# Patient Record
Sex: Male | Born: 1975
Health system: Southern US, Community
[De-identification: ages and names within clinical notes are randomized; demographics above are authoritative.]

## PROBLEM LIST (undated history)

## (undated) DIAGNOSIS — I10 Essential (primary) hypertension: Secondary | ICD-10-CM

## (undated) DIAGNOSIS — M549 Dorsalgia, unspecified: Secondary | ICD-10-CM

## (undated) DIAGNOSIS — E119 Type 2 diabetes mellitus without complications: Secondary | ICD-10-CM

## (undated) DIAGNOSIS — K7581 Nonalcoholic steatohepatitis (NASH): Secondary | ICD-10-CM

## (undated) HISTORY — DX: Nonalcoholic steatohepatitis (NASH): K75.81

## (undated) HISTORY — PX: KNEE SURGERY: SHX244

## (undated) HISTORY — PX: CHOLECYSTECTOMY: SHX55

## (undated) HISTORY — PX: SHOULDER SURGERY: SHX246

---

## 2013-08-30 ENCOUNTER — Encounter (HOSPITAL_COMMUNITY): Payer: Self-pay | Admitting: Emergency Medicine

## 2013-08-30 ENCOUNTER — Emergency Department (HOSPITAL_COMMUNITY): Payer: No Typology Code available for payment source

## 2013-08-30 ENCOUNTER — Emergency Department (HOSPITAL_COMMUNITY)
Admission: EM | Admit: 2013-08-30 | Discharge: 2013-08-30 | Disposition: A | Discharge status: Home / Self Care | Payer: No Typology Code available for payment source | Attending: Emergency Medicine | Admitting: Emergency Medicine

## 2013-08-30 DIAGNOSIS — F17200 Nicotine dependence, unspecified, uncomplicated: Secondary | ICD-10-CM | POA: Insufficient documentation

## 2013-08-30 DIAGNOSIS — Y92410 Unspecified street and highway as the place of occurrence of the external cause: Secondary | ICD-10-CM | POA: Insufficient documentation

## 2013-08-30 DIAGNOSIS — Y9389 Activity, other specified: Secondary | ICD-10-CM | POA: Insufficient documentation

## 2013-08-30 DIAGNOSIS — S0993XA Unspecified injury of face, initial encounter: Secondary | ICD-10-CM | POA: Insufficient documentation

## 2013-08-30 DIAGNOSIS — S335XXA Sprain of ligaments of lumbar spine, initial encounter: Secondary | ICD-10-CM | POA: Insufficient documentation

## 2013-08-30 DIAGNOSIS — S39012A Strain of muscle, fascia and tendon of lower back, initial encounter: Secondary | ICD-10-CM

## 2013-08-30 DIAGNOSIS — S199XXA Unspecified injury of neck, initial encounter: Secondary | ICD-10-CM

## 2013-08-30 HISTORY — DX: Dorsalgia, unspecified: M54.9

## 2013-08-30 MED ORDER — NAPROXEN 375 MG PO TABS: 375.0000 mg | ORAL_TABLET | Freq: Two times a day (BID) | ORAL | Status: DC

## 2013-08-30 MED ORDER — METHOCARBAMOL 500 MG PO TABS: 500.0000 mg | ORAL_TABLET | Freq: Two times a day (BID) | ORAL | Status: DC

## 2013-08-30 NOTE — ED Notes (Signed)
Patient transported to X-ray, ambulated w/o difficulty to xray

## 2013-08-30 NOTE — Discharge Instructions (Signed)
Please call your doctor for a followup appointment within 24-48 hours. When you talk to your doctor please let them know that you were seen in the emergency department and have them acquire all of your records so that they can discuss the findings with you and formulate a treatment plan to fully care for your new and ongoing problems. Please call and set-up an appointment with health and wellness center Please call and set-up an appointment with orthopedics Please take medications as prescribed While on naproxen please do not take any anti-inflammatories for this can lead to GI bleeds-for example ibuprofen, Motrin Please rest and stay hydrated Please avoid any physical or strenuous activity Please continue to monitor symptoms closely and if symptoms are to worsen or change (fever greater than 101, chills, chest pain, shortness of breath, difficulty breathing, numbness, tingling, fall, injury, headaches, dizziness, confusion, inability to control urine or bowel movements, worsening or changes to pain) please report back to the ED immediately   Back Pain, Adult Back pain is very common. The pain often gets better over time. The cause of back pain is usually not dangerous. Most people can learn to manage their back pain on their own.  HOME CARE   Stay active. Start with short walks on flat ground if you can. Try to walk farther each day.  Do not sit, drive, or stand in one place for more than 30 minutes. Do not stay in bed.  Do not avoid exercise or work. Activity can help your back heal faster.  Be careful when you bend or lift an object. Bend at your knees, keep the object close to you, and do not twist.  Sleep on a firm mattress. Lie on your side, and bend your knees. If you lie on your back, put a pillow under your knees.  Only take medicines as told by your doctor.  Put ice on the injured area.  Put ice in a plastic bag.  Place a towel between your skin and the bag.  Leave the ice  on for 15-20 minutes, 03-04 times a day for the first 2 to 3 days. After that, you can switch between ice and heat packs.  Ask your doctor about back exercises or massage.  Avoid feeling anxious or stressed. Find good ways to deal with stress, such as exercise. GET HELP RIGHT AWAY IF:   Your pain does not go away with rest or medicine.  Your pain does not go away in 1 week.  You have new problems.  You do not feel well.  The pain spreads into your legs.  You cannot control when you poop (bowel movement) or pee (urinate).  Your arms or legs feel weak or lose feeling (numbness).  You feel sick to your stomach (nauseous) or throw up (vomit).  You have belly (abdominal) pain.  You feel like you may pass out (faint). MAKE SURE YOU:   Understand these instructions.  Will watch your condition.  Will get help right away if you are not doing well or get worse. Document Released: 10/16/2007 Document Revised: 07/22/2011 Document Reviewed: 09/17/2010 [facility] Patient Information 2014 [place].  Back Exercises Back exercises help treat and prevent back injuries. The goal is to increase your strength in your belly (abdominal) and back muscles. These exercises can also help with flexibility. Start these exercises when told by your doctor. HOME CARE Back exercises include: Pelvic Tilt.  Lie on your back with your knees bent. Tilt your pelvis until the lower  part of your back is against the floor. Hold this position 5 to 10 sec. Repeat this exercise 5 to 10 times. Knee to Chest.  Pull 1 knee up against your chest and hold for 20 to 30 seconds. Repeat this with the other knee. This may be done with the other leg straight or bent, whichever feels better. Then, pull both knees up against your chest. Sit-Ups or Curl-Ups.  Bend your knees 90 degrees. Start with tilting your pelvis, and do a partial, slow sit-up. Only lift your upper half 30 to 45 degrees off the floor. Take at least  2 to 3 seonds for each sit-up. Do not do sit-ups with your knees out straight. If partial sit-ups are difficult, simply do the above but with only tightening your belly (abdominal) muscles and holding it as told. Hip-Lift.  Lie on your back with your knees flexed 90 degrees. Push down with your feet and shoulders as you raise your hips 2 inches off the floor. Hold for 10 seconds, repeat 5 to 10 times. Back Arches.  Lie on your stomach. Prop yourself up on bent elbows. Slowly press on your hands, causing an arch in your low back. Repeat 3 to 5 times. Shoulder-Lifts.  Lie face down with arms beside your body. Keep hips and belly pressed to floor as you slowly lift your head and shoulders off the floor. Do not overdo your exercises. Be careful in the beginning. Exercises may cause you some mild back discomfort. If the pain lasts for more than 15 minutes, stop the exercises until you see your doctor. Improvement with exercise for back problems is slow.  Document Released: 06/01/2010 Document Revised: 07/22/2011 Document Reviewed: 02/28/2011 [facility] Patient Information 2014 [place], [place].   Emergency Department Resource Guide 1) Find a Doctor and Pay Out of Pocket Although you won't have to find out who is covered by your insurance plan, it is a good idea to ask around and get recommendations. You will then need to call the office and see if the doctor you have chosen will accept you as a new patient and what types of options they offer for patients who are self-pay. Some doctors offer discounts or will set up payment plans for their patients who do not have insurance, but you will need to ask so you aren't surprised when you get to your appointment.  2) Contact Your Local Health Department Not all health departments have doctors that can see patients for sick visits, but many do, so it is worth a call to see if yours does. If you don't know where your local health department is, you can check in  your phone book. The CDC also has a tool to help you locate your state's health department, and many state websites also have listings of all of their local health departments.  3) Find a [facility] If your illness is not likely to be very severe or complicated, you may want to try a walk in clinic. These are popping up all over the country in pharmacies, drugstores, and shopping centers. They're usually staffed by nurse practitioners or physician assistants that have been trained to treat common illnesses and complaints. They're usually fairly quick and inexpensive. However, if you have serious medical issues or chronic medical problems, these are probably not your best option.  No Primary Care Doctor: - Call Health Connect at  [phone] - they can help you locate a primary care doctor that  accepts your insurance, provides certain services,  etc. - Physician Referral Service- [phone]  Chronic Pain Problems: Organization         Address  Phone   Notes  [facility]  [phone] Patients need to be referred by their primary care doctor.   Medication Assistance: Organization         Address  Phone   Notes  [facility] [address] [place], [place] [address] [phone] --Must be a resident of [facility] -- Must have NO insurance coverage whatsoever (no Medicaid/ Medicare, etc.) -- The pt. MUST have a primary care doctor that directs their care regularly and follows them in the community   Med[facility]ist  [phone]   [employer]  [phone]    Agencies that provide inexpensive medical care: Organization         Address  Phone   Notes  [place]  [phone]   [facility]    [phone]   [facility] [place], [place] [address] [phone]   [place] [address], [place] [phone]    Planned Parenthood    [phone]   [facility]    [phone]   [place] and [place] [address], [place] Phone:  [phone], Fax:  [phone] Hours of Operation:  9 am - 6 pm, M-F.  Also accepts Medicaid/Medicare and self-pay.  [facility] for [place], [address], [place] Phone: [phone], Fax: [phone]. Hours of Operation:  8:30 am - 5:30 pm, M-F.  Also accepts Medicaid and self-pay.  [facility] [place] [address] [place] Phone: [phone]   [place], [place], [place] [phone], Ext. [phone] Mondays & Thursdays: 7-9 AM.  First 15 patients are seen on a first come, first serve basis.    [place]ale Providers:  Organization         Address  Phone   Notes  [facility] [address], [place] [phone] Also accepts self-pay patients.  [facility] [address], [place]  [phone]   [place], [address], [place] [phone]   [facility] [address], [place] [phone]   Lucianne Lei [address], [place]   [phone] Only accepts [place] Access [place] patients after they have their name applied to their card.   Self-Pay (no insurance) in [facility]:  Organization         Address  Phone   Notes  Sickle Cell Patients, [facility] [place] [phone]   [facility] [place] [phone]   [facility] [place]  [place], [place], [place] [phone]   [facility]/Dr. Osei-Bonsu  [address], [place] or [address], [place] [phone] Phone number for both [place] and [place] locations is the  same.  Urgent Medical and [facility]) [address] [place] [phone]   [facility] [address] [place] or [address] [phone]   [facility] [place], [place] [phone], phone; [phone], fax Sees patients 1st and 3rd Saturday of every month.  Must not qualify for public or private insurance (i.e. Medicaid, Medicare, [place] Health Choice, Veterans' Benefits)  Household income should be no more than 200% of the poverty level The clinic cannot treat you if you are pregnant or think you are pregnant  Sexually transmitted diseases are not treated at the clinic.    Dental Care: Organization         Address  Phone  Notes  [facility] Department of [facility] [phone] Accepts children up to age 73 who are enrolled in [place] or [place]; pregnant women with a Medicaid card; and children who have applied for Medicaid or [place] Health Choice, but were declined, whose parents can pay a reduced fee at time of service.  [facility] Department of [facility]  [address], [place] [phone] Accepts children up to age 62 who are enrolled in [place] or [place]; pregnant women with a Medicaid card; and children who have applied for Medicaid or [place] Health Choice, but were declined, whose parents can pay a reduced fee at time of service.  [facility] Adult [facility] Access PROGRAM  [place] [phone] Patients are seen by appointment only. Walk-ins are not accepted. War will see patients 78 years of age and older. Monday - Tuesday (8am-5pm) Most Wednesdays (8:30-5pm) $30 per visit, cash only  [facility] Adult Dental Access PROGRAM  [address], [facility] [phone] Patients are seen by appointment only. Walk-ins are not accepted. Ida will see patients  29 years of age and older. One Wednesday Evening (Monthly: Volunteer Based).  $30 per visit, cash only  [place]  [phone] for adults; Children under age 52, call Graduate Pediatric Dentistry at [phone]. Children aged 65-14, please call [phone] to request a pediatric application.  Dental services are provided in all areas of dental care including fillings, crowns and bridges, complete and partial dentures, implants, gum treatment, root canals, and extractions. Preventive care is also provided. Treatment is provided to both adults and children. Patients are selected via a lottery and there is often a waiting list.   [facility] [address] [place]  [phone] [email or web address]   [facility] [address] [place], [place] [phone], Ext. [phone] Second and Fourth Thursday of each month, opens at 6:30 AM; Clinic ends at 9 AM.  Patients are seen on a first-come first-served basis, and a limited number are seen during each clinic.   [facility]  [address] [place], [place] [phone]   Eligibility Requirements You must have lived in [place], [place], or [place] counties for at least the last three months.   You cannot be eligible for state or federal sponsored [employer], including [employer], [place], or [employer].   You generally cannot be eligible for healthcare insurance through your employer.    How to apply: Eligibility screenings are held every Tuesday and Wednesday afternoon from 1:00 pm until 4:00 pm. You do not need an appointment for the interview!  [facility] [address], [place], [place]   [place]  [place]  [place]  [phone]    Behavioral Health Resources  in the Community: Intensive Outpatient  Programs Organization         Address  Phone  Notes  [facility]. [address], [place], [place] [phone]   [facility] Outpatient [address] [place], [place]   ADS: Alcohol & Drug [facility] [address] [place], [place]   [employer] [address],  [place], [place] or [phone]   Substance Abuse Resources Organization         Address  Phone  Notes  Alcohol and Drug Services  [phone]   [place]  [phone]   [facility] [place]   Chinita Pester  [phone]   Residential & Outpatient Substance Abuse Program  [phone]   Psychological Services Organization         Address  Phone  Notes  [facility] [place]  [place]  [phone]   [place] [address], [place] or [phone]    Mobile Crisis Teams Organization         Address  Phone  Notes  [facility], Mobile Crisis Care Unit  [phone]   [facility]  [address] [place], [place]   [place] [address] [place] [phone]    Self-Help/Support Groups Organization         Address  Phone             Notes  [place]. of [place] - variety of support groups  [place] Call for more information  Narcotics Anonymous (NA), [facility] [address], [employer] [place]  2 meetings at this location   Special educational needs teacher         Address  Phone  Notes  ASAP Residential Treatment [place],    [place]  [phone]   [facility]  [address] [place] [address], [place], [place]   [place], [place] [phone] Admissions: 8am-3pm M-F  Incentives Substance [place] [address],    [place], [place]  [phone]   [facility] [address] [place], [place], [place]   [facility] [address],  [place], [place]   [facility]ight Programs - Intensive Outpatient [address], [place], [place]   [facility] ([place].,  [place], [place] [phone] or [phone]   [facility]idential Treatment Services (RTS) [address] [place], [place] Accepts Medicaid  [facility] [place] [address]  [place] [phone] Substance Abuse/Addiction Treatment   [facility] Organization         Address  Phone  Notes  [facility]  [phone]   Domenic Schwab, PhD [address] [place]lis [place] [employer], [place]   [phone] or [phone]   [place], [place] [phone]   [place] [address] [place], [place] [phone] Insurance/Medicaid/sponsorship through [employer] and Families [address] [place]                                    [place], [place] [phone] [place] [address]   [place], [place] [phone])  [phone]    Dr. Adele Schilder  [phone]   [facility] Dept. 1) [address], [place] 2) [place] 3)  [place] [address], [place] [phone]  [phone]   [place] [phone] or [phone] (After Hours)
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
## 2013-08-30 NOTE — ED Notes (Signed)
Pt ambulated to restroom w/o difficulty, no assistance needed.
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
## 2013-08-30 NOTE — ED Notes (Signed)
Pt ambulated in hallway w/ no difficulty, seen by PA.
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
## 2013-08-31 NOTE — ED Provider Notes (Signed)
Medical screening examination/treatment/procedure(s) were performed by non-physician practitioner and as supervising physician I was immediately available for consultation/collaboration.   EKG Interpretation None       Virgel Manifold, MD 08/31/13 0700

## 2013-12-16 DIAGNOSIS — Z9889 Other specified postprocedural states: Secondary | ICD-10-CM | POA: Insufficient documentation

## 2013-12-16 DIAGNOSIS — M1711 Unilateral primary osteoarthritis, right knee: Secondary | ICD-10-CM | POA: Insufficient documentation

## 2013-12-16 HISTORY — DX: Other specified postprocedural states: Z98.890

## 2013-12-16 HISTORY — DX: Unilateral primary osteoarthritis, right knee: M17.11

## 2016-11-15 ENCOUNTER — Emergency Department (HOSPITAL_COMMUNITY)
Admission: EM | Admit: 2016-11-15 | Discharge: 2016-11-16 | Disposition: A | Discharge status: Home / Self Care | Payer: Medicare Other | Attending: Emergency Medicine | Admitting: Emergency Medicine

## 2016-11-15 ENCOUNTER — Encounter (HOSPITAL_COMMUNITY): Payer: Self-pay | Admitting: Emergency Medicine

## 2016-11-15 ENCOUNTER — Emergency Department (HOSPITAL_COMMUNITY): Payer: Medicare Other

## 2016-11-15 DIAGNOSIS — M25561 Pain in right knee: Secondary | ICD-10-CM | POA: Diagnosis not present

## 2016-11-15 DIAGNOSIS — F17200 Nicotine dependence, unspecified, uncomplicated: Secondary | ICD-10-CM | POA: Insufficient documentation

## 2016-11-15 NOTE — ED Triage Notes (Signed)
Pt c/o R knee pain worsening last 3 weeks, pt reports increasing swelling recently. Surgery to same several years ago.

## 2016-11-15 NOTE — ED Provider Notes (Signed)
[facility] DEPT Provider Note   CSN: [id number] Arrival date & time: 11/15/16  2250  By signing my name below, I, Collene Leyden, attest that this documentation has been prepared under the direction and in the presence of [employer]. Electronically Signed: Collene Leyden, Scribe. 11/15/16. 12:05 AM.  History   Chief Complaint Chief Complaint  Patient presents with  . Knee Pain    HPI Comments: Todd Rose is a 41 y.o. male with a history of right knee surgery (arthroscopy repair of the lateral meniscus and ACL), who presents to the Emergency Department complaining of gradual-onset, constant right knee pain that began one month ago. Patient states his pain had progressively worsened over the past week to the point that his right knee gives out. Patient denies any new injury or fall. Patient reports an associated burning sensation and warmness on palpation of the lateral R knee ("feels like your hands are hot"). Patient reports taking motrin with mild relief. Patient is ambulatory in the emergency department without difficulty. Ambulating worsens his pain. Patient denies any numbness, tingling, weakness, fever, or chills. No history of blood clots. Patient denies any pain, recent travel, or any additional symptoms.   The history is provided by the patient. No language interpreter was used.    Past Medical History:  Diagnosis Date  . Back pain     There are no active problems to display for this patient.   Past Surgical History:  Procedure Laterality Date  . CHOLECYSTECTOMY    . KNEE SURGERY    . SHOULDER SURGERY     left       Home Medications    Prior to Admission medications   Medication Sig Start Date End Date Taking? Authorizing Provider  ibuprofen (ADVIL,MOTRIN) 200 MG tablet Take 800 mg by mouth every 6 (six) hours as needed for moderate pain.    [provider]  methocarbamol (ROBAXIN) 500 MG tablet Take 1 tablet (500 mg total) by mouth 2 (two)  times daily. 08/30/13   Sciacca, Marissa, PA-C  naproxen (NAPROSYN) 375 MG tablet Take 1 tablet (375 mg total) by mouth 2 (two) times daily. 08/30/13   Sciacca, Marissa, PA-C  Probiotic Product (PROBIOTIC DAILY) CAPS Take 1 capsule by mouth daily.    [provider]    Family History No family history on file.  Social History Social History  Substance Use Topics  . Smoking status: Current Every Day Smoker  . Smokeless tobacco: Never Used  . Alcohol use Yes     Allergies   Tramadol   Review of Systems Review of Systems  Constitutional: Negative for chills and fever.  Musculoskeletal: Positive for arthralgias (right knee). Negative for joint swelling.  Neurological: Negative for weakness and numbness.     Physical Exam Updated Vital Signs BP (!) 130/96 (BP Location: Left Arm)   Pulse 93   Temp 98.3 F (36.8 C) (Oral)   Resp 18   Ht 5' 7"  (1.702 m)   Wt 119.7 kg (264 lb)   SpO2 96%   BMI 41.35 kg/m   Physical Exam  Constitutional: He appears well-developed and well-nourished. No distress.  HENT:  Head: Normocephalic and atraumatic.  Mouth/Throat: Oropharynx is clear and moist. No oropharyngeal exudate.  Eyes: Conjunctivae are normal. Pupils are equal, round, and reactive to light. Right eye exhibits no discharge. Left eye exhibits no discharge. No scleral icterus.  Neck: Normal range of motion. Neck supple. No thyromegaly present.  Cardiovascular: Normal rate, regular rhythm, normal  heart sounds and intact distal pulses.  Exam reveals no gallop and no friction rub.   No murmur heard. Pulmonary/Chest: Effort normal and breath sounds normal. No stridor. No respiratory distress. He has no wheezes. He has no rales.  Musculoskeletal: Normal range of motion. He exhibits tenderness. He exhibits no edema or deformity.  Tenderness to the right, lateral knee over the LCL; pain to lateral knee with varus stress; negative anterior/posterior drawer, negative McMurray's  test, no warmth or erythema, no edema noted, no pain with passive range of motion; normal sensation  Lymphadenopathy:    He has no cervical adenopathy.  Neurological: He is alert. Coordination normal.  Skin: Skin is warm and dry. No rash noted. He is not diaphoretic. No pallor.  Psychiatric: He has a normal mood and affect.  Nursing note and vitals reviewed.    ED Treatments / Results  DIAGNOSTIC STUDIES: Oxygen Saturation is 96% on RA, normal by my interpretation.    COORDINATION OF CARE: 12:04 AM Discussed treatment plan with pt at bedside and pt agreed to plan, which includes a knee brace, ibuprofen, and tylenol.   Labs (all labs ordered are listed, but only abnormal results are displayed) Labs Reviewed - No data to display  EKG  EKG Interpretation None       Radiology Dg Knee Complete 4 Views Right  Result Date: 11/15/2016 CLINICAL DATA:  Right knee pain times 4-[redacted] weeks along the lateral aspect. Prior history of surgery to the right knee. EXAM: RIGHT KNEE - COMPLETE 4+ VIEW COMPARISON:  08/08/2016 FINDINGS: No evidence of fracture, dislocation, or joint effusion. No evidence of arthropathy or other focal bone abnormality. Soft tissues are unremarkable. IMPRESSION: Negative. Electronically Signed   By: Ashley Royalty M.D.   On: 11/15/2016 23:41    Procedures Procedures (including critical care time)  Medications Ordered in ED Medications - No data to display   Initial Impression / Assessment and Plan / ED Course  I have reviewed the triage vital signs and the nursing notes.  Pertinent labs & imaging results that were available during my care of the patient were reviewed by me and considered in my medical decision making (see chart for details).      Patient X-Ray negative for obvious fracture or dislocation. Doubt septic joint or other emergent pathology at this time. Gout unlikely as burning sensation only to lateral aspect of the knee. Pt advised to follow up with  orthopedics For further evaluation and treatment. Patient given a right knee brace while in ED, conservative therapy recommended and discussed. Patient will be discharged home & is agreeable with above plan. I discussed patient case with Dr. Wyvonnia Dusky his agrees with plan.  Final Clinical Impressions(s) / ED Diagnoses   Final diagnoses:  Acute pain of right knee    New Prescriptions New Prescriptions   No medications on file   I personally performed the services described in this documentation, which was scribed in my presence. The recorded information has been reviewed and is accurate.     Frederica Kuster, PA-C 11/16/16 Lindell Noe, MD 11/16/16 [phone]

## 2016-11-16 NOTE — Discharge Instructions (Signed)
Use ice 3-4 times daily alternating 20 minutes on, 20 minutes off. Use your knee sleeve at all times except when bathing. Keep your leg elevated when of your not walking on it. Please follow up with Dr. Ronnie Derby for further evaluation and treatment of your knee pain.

## 2017-04-23 ENCOUNTER — Other Ambulatory Visit: Payer: Self-pay

## 2017-04-23 ENCOUNTER — Encounter (HOSPITAL_COMMUNITY): Payer: Self-pay

## 2017-04-23 ENCOUNTER — Emergency Department (HOSPITAL_COMMUNITY): Payer: Medicare Other

## 2017-04-23 ENCOUNTER — Emergency Department (HOSPITAL_COMMUNITY)
Admission: EM | Admit: 2017-04-23 | Discharge: 2017-04-23 | Disposition: A | Discharge status: Home / Self Care | Payer: Medicare Other | Attending: Emergency Medicine | Admitting: Emergency Medicine

## 2017-04-23 DIAGNOSIS — X509XXA Other and unspecified overexertion or strenuous movements or postures, initial encounter: Secondary | ICD-10-CM | POA: Insufficient documentation

## 2017-04-23 DIAGNOSIS — F17200 Nicotine dependence, unspecified, uncomplicated: Secondary | ICD-10-CM | POA: Insufficient documentation

## 2017-04-23 DIAGNOSIS — Z79899 Other long term (current) drug therapy: Secondary | ICD-10-CM | POA: Diagnosis not present

## 2017-04-23 DIAGNOSIS — Y999 Unspecified external cause status: Secondary | ICD-10-CM | POA: Diagnosis not present

## 2017-04-23 DIAGNOSIS — Y939 Activity, unspecified: Secondary | ICD-10-CM | POA: Diagnosis not present

## 2017-04-23 DIAGNOSIS — R300 Dysuria: Secondary | ICD-10-CM | POA: Diagnosis not present

## 2017-04-23 DIAGNOSIS — Y929 Unspecified place or not applicable: Secondary | ICD-10-CM | POA: Insufficient documentation

## 2017-04-23 DIAGNOSIS — M25561 Pain in right knee: Secondary | ICD-10-CM | POA: Diagnosis not present

## 2017-04-23 LAB — URINALYSIS, ROUTINE W REFLEX MICROSCOPIC
Bilirubin Urine: NEGATIVE
Glucose, UA: NEGATIVE mg/dL
Hgb urine dipstick: NEGATIVE
Ketones, ur: NEGATIVE mg/dL
Leukocytes, UA: NEGATIVE
NITRITE: NEGATIVE
Protein, ur: NEGATIVE mg/dL
Specific Gravity, Urine: 1.019 (ref 1.005–1.030)
pH: 5 (ref 5.0–8.0)

## 2017-04-23 MED ORDER — MELOXICAM 15 MG PO TABS: 15.0000 mg | ORAL_TABLET | Freq: Every day | ORAL | 0 refills | Status: DC

## 2017-04-23 MED ORDER — ONDANSETRON 4 MG PO TBDP
4.0000 mg | ORAL_TABLET | Freq: Once | ORAL | Status: AC
  Administered 2017-04-23: 4 mg via ORAL
  Filled 2017-04-23: qty 1

## 2017-04-23 MED ORDER — CYCLOBENZAPRINE HCL 10 MG PO TABS: 10.0000 mg | ORAL_TABLET | Freq: Two times a day (BID) | ORAL | 0 refills | Status: DC | PRN

## 2017-04-23 MED ORDER — OXYCODONE-ACETAMINOPHEN 5-325 MG PO TABS
1.0000 | ORAL_TABLET | Freq: Once | ORAL | Status: AC
  Administered 2017-04-23: 1 via ORAL
  Filled 2017-04-23: qty 1

## 2017-04-23 NOTE — ED Notes (Signed)
Ortho has been paged to apply knee immobilizer.

## 2017-04-23 NOTE — ED Triage Notes (Signed)
PT reports hx of small torn meniscus to right knee that orthopedic dr didn't feel was severe enough for operation. 2 days ago pt states he stepped in a hole and twisted knee. Ever since pt has experienced increased pain to knee with extension.

## 2017-04-23 NOTE — ED Notes (Signed)
On way to XR 

## 2017-04-23 NOTE — Progress Notes (Signed)
Orthopedic Tech Progress Note Patient Details:  Todd Rose 1976/01/29 [id number]  Ortho Devices Type of Ortho Device: Knee Immobilizer Ortho Device/Splint Location: RLE Ortho Device/Splint Interventions: Ordered, Application   Post Interventions Patient Tolerated: Well Instructions Provided: Care of device   Braulio Bosch 04/23/2017, 10:23 PM

## 2017-04-23 NOTE — Discharge Instructions (Signed)
The x-ray of your knee did not demonstrate any fractures or broken bones.  Your exam was concerning for involvement of the LCL ligament and the knee.  Please wear the knee immobilizer while you are walking until you are seen next week by Dr. Ronnie Derby.  Apply ice to the knee for 15-20 minutes up to 3-4 times a day.  You can take the knee immobilizer off when you are resting, but please elevate the leg up above the level of your heart.   Take 1 tablet of meloxicam with food daily to help with knee and back pain.  It is important that you take this medication with food so you do not upset your stomach.  Do not take Motrin, ibuprofen, naproxen, or Aleve while taking this medication.  Flexeril can be taken up to 2 times daily to help with muscle pain and spasms.  Do not drive or work while taking this medication because it can make you drowsy.   It appears that the deep blue cream that you have used for aches and pains contains peppermint oil.  There is several over-the-counter products available that also contain peppermint oil that may also be applied directly to the skin.  If you continue to have discomfort with urination, please follow-up with [facility] Scal[facility].  Your urine sample today did not look concerning for a kidney stone or infection.  It sounds as if you were previously diagnosed with an enlarged prostate also called benign prostatic hyperplasia.   If you develop new or worsening symptoms, including fever, chills, if the knee becomes red, hot, or swollen, or if you develop weakness in the leg, or other concerning symptoms, please return to the emergency department for reevaluation.

## 2017-04-23 NOTE — ED Provider Notes (Signed)
[facility] EMERGENC[facility] DEPARTMENT Provider Note   CSN: [id number] Arrival date & time: 04/23/17  1737     History   Chief Complaint Chief Complaint  Patient presents with  . Knee Injury    HPI Todd Rose is a 41 y.o. male with a history of chronic low back pain and a right medial meniscus tear status post arthroscopy who presents to the emergency department with a chief complaint of right knee pain that began 2 days ago after he stepped into a hole.  He reports that after his foot landed in the whole that his knee inverted to the left while his torso twisted externally to the right. He reports that he has able to ambulate following the incident.  He reports constant, sharp pain over the lateral and posterior aspect of the right knee that is aggravated with being in one position for too long and improved by nothing.  He has treated his symptoms with elevation, ice, and rest without improvement.  He denies any additional falls, but states that it feels like his knee might give out at times.  He denies right hip, ankle, or left knee pain.  He denies hitting his head, LOC, nausea, or emesis.  He also complains of intermittent, worsening left flank pain with associated nausea, 2-3 episodes of NBNB emesis, dysuria, and urgency with voiding over the last 2 weeks. No hematuria, fever, chills, abdominal pain.   No h/o of nephrolithiasis or UTI. He reports a familial h/o of nephrolithiasis.   He was a follow up appointment with his orthopedist Dr. Ronnie Derby one week from tomorrow.    The history is provided by the patient. No language interpreter was used.    Past Medical History:  Diagnosis Date  . Back pain     There are no active problems to display for this patient.   Past Surgical History:  Procedure Laterality Date  . CHOLECYSTECTOMY    . KNEE SURGERY    . SHOULDER SURGERY     left     Home Medications    Prior to Admission medications   Medication Sig Start  Date End Date Taking? Authorizing Provider  cyclobenzaprine (FLEXERIL) 10 MG tablet Take 1 tablet (10 mg total) by mouth 2 (two) times daily as needed for muscle spasms. 04/23/17   Sally Reimers A, PA-C  ibuprofen (ADVIL,MOTRIN) 200 MG tablet Take 800 mg by mouth every 6 (six) hours as needed for moderate pain.    [provider]  meloxicam (MOBIC) 15 MG tablet Take 1 tablet (15 mg total) by mouth daily. 04/23/17   Brand Siever A, PA-C  methocarbamol (ROBAXIN) 500 MG tablet Take 1 tablet (500 mg total) by mouth 2 (two) times daily. 08/30/13   Sciacca, Marissa, PA-C  naproxen (NAPROSYN) 375 MG tablet Take 1 tablet (375 mg total) by mouth 2 (two) times daily. 08/30/13   Sciacca, Marissa, PA-C  Probiotic Product (PROBIOTIC DAILY) CAPS Take 1 capsule by mouth daily.    [provider]    Family History No family history on file.  Social History Social History   Tobacco Use  . Smoking status: Current Every Day Smoker  . Smokeless tobacco: Never Used  Substance Use Topics  . Alcohol use: Yes  . Drug use: No     Allergies   Tramadol   Review of Systems Review of Systems  Constitutional: Negative for chills and fever.  Musculoskeletal: Positive for arthralgias, gait problem, joint swelling and myalgias.  Skin:  Negative for color change and wound.  Neurological: Negative for weakness, numbness and headaches.   Physical Exam Updated Vital Signs BP (!) 158/121   Pulse 91   Temp 98.4 F (36.9 C) (Oral)   Resp 18   Ht 5' 7"  (1.702 m)   Wt 109.8 kg (242 lb)   SpO2 96%   BMI 37.90 kg/m   Physical Exam  Constitutional: He appears well-developed.  HENT:  Head: Normocephalic.  Eyes: Conjunctivae are normal.  Neck: Neck supple.  Cardiovascular: Normal rate, regular rhythm, normal heart sounds and intact distal pulses. Exam reveals no gallop and no friction rub.  No murmur heard. Pulmonary/Chest: Effort normal and breath sounds normal. No stridor. No respiratory  distress. He has no wheezes. He has no rales. He exhibits no tenderness.  Abdominal: Soft. He exhibits no distension. There is tenderness.  Tender to palpation over the left flank and left CVA.  That tracks to the left lower quadrant.  No rebound or guarding.  The remainder of the abdominal exam is unremarkable.  No right CVA tenderness.  Negative Murphy's.  No tenderness over McBurney's point.  No peritoneal signs.  Musculoskeletal: He exhibits tenderness. He exhibits no edema or deformity.  Point tenderness to palpation over the medial aspect of the right tibial plateau.  No medial joint line tenderness.  No lateral joint line tenderness.  Increased pain with active range of motion, worse with flexion of the right knee.  Patella tracks well.  Negative anterior posterior drawer test.  Significant pain with varus stress test, concerning for the LCL.  No tenderness to palpation over the quadriceps or patellar tendon.  No obvious joint effusion.  No overlying erythema, edema, redness, or warmth.  Full active and passive range of motion of the right hip and ankle.  No tenderness to palpation over the right hip or ankle.  Normal examination of the left lower extremity.  Sensation is intact and equal throughout the bilateral lower extremities.  DP and PT pulses are 2+ and symmetric.  5 out of 5 strength against resistance with dorsi and plantar flexion of the bilateral lower extremities.  Diffuse tenderness to palpation of the spinous processes of the lumbar spine.  No tenderness of the spinous processes of the cervical or thoracic lumbar spine.  The surrounding paraspinal muscles are non-tender.   Neurological: He is alert.  Skin: Skin is warm and dry.  Psychiatric: His behavior is normal.  Nursing note and vitals reviewed.    ED Treatments / Results  Labs (all labs ordered are listed, but only abnormal results are displayed) Labs Reviewed  URINALYSIS, ROUTINE W REFLEX MICROSCOPIC    EKG  EKG  Interpretation None       Radiology Dg Knee Complete 4 Views Right  Result Date: 04/23/2017 CLINICAL DATA:  Medial right knee tenderness, post twisting injury. EXAM: RIGHT KNEE - COMPLETE 4+ VIEW COMPARISON:  None. FINDINGS: No evidence of fracture, dislocation, or joint effusion. No evidence of arthropathy or other focal bone abnormality. Soft tissues are unremarkable. IMPRESSION: Negative. Electronically Signed   By: Fidela Salisbury M.D.   On: 04/23/2017 20:49    Procedures Procedures (including critical care time)  Medications Ordered in ED Medications  oxyCODONE-acetaminophen (PERCOCET/ROXICET) 5-325 MG per tablet 1 tablet (1 tablet Oral Given 04/23/17 2023)  ondansetron (ZOFRAN-ODT) disintegrating tablet 4 mg (4 mg Oral Given 04/23/17 2023)     Initial Impression / Assessment and Plan / ED Course  I have reviewed the triage vital signs  and the nursing notes.  Pertinent labs & imaging results that were available during my care of the patient were reviewed by me and considered in my medical decision making (see chart for details).     41 year old male with a history of chronic right knee pain and chronic low back pain presenting with acute right knee pain and exacerbation of low back pain after a fall.  He also reports dysuria with urination.  No CVA tenderness.  No abdominal pain.  He is hemodynamically stable.  X-ray of the right knee is not concerning for fracture.  On physical exam, he becomes tearful on exam during the varus stress test, concerning for involvement of the LCL ligament.  It does not seem unstable, but will place the patient in a knee immobilizer until he was able to see his orthopedist, Dr. Ronnie Derby, next week.  Will also send the patient home with symptomatic treatment.  Urinalysis is also unremarkable for infection or nephrolithiasis.  The patient reports he was previously told "something about his prostate"and was given a medication by his PCP, but he stopped  taking it sometimes ago.  This sounds concerning for BPH.  Encouraged follow-up with his PCP if symptoms persist.  Strict return precautions given.  No acute distress.  The patient is safe for discharge at this time.  Final Clinical Impressions(s) / ED Diagnoses   Final diagnoses:  Acute pain of right knee  Dysuria    ED Discharge Orders        Ordered    cyclobenzaprine (FLEXERIL) 10 MG tablet  2 times daily PRN     04/23/17 2201    meloxicam (MOBIC) 15 MG tablet  Daily     04/23/17 2201       Joline Maxcy A, PA-C 04/23/17 2205    Fatima Blank, MD 04/26/17 [phone]

## 2017-09-09 ENCOUNTER — Ambulatory Visit: Payer: Self-pay | Admitting: Podiatry

## 2017-11-09 ENCOUNTER — Emergency Department (HOSPITAL_COMMUNITY): Payer: Medicare Other

## 2017-11-09 ENCOUNTER — Encounter (HOSPITAL_COMMUNITY): Payer: Self-pay | Admitting: Emergency Medicine

## 2017-11-09 ENCOUNTER — Emergency Department (HOSPITAL_COMMUNITY)
Admission: EM | Admit: 2017-11-09 | Discharge: 2017-11-09 | Disposition: A | Discharge status: Home / Self Care | Payer: Medicare Other | Attending: Emergency Medicine | Admitting: Emergency Medicine

## 2017-11-09 DIAGNOSIS — N3001 Acute cystitis with hematuria: Secondary | ICD-10-CM | POA: Insufficient documentation

## 2017-11-09 DIAGNOSIS — F17200 Nicotine dependence, unspecified, uncomplicated: Secondary | ICD-10-CM | POA: Diagnosis not present

## 2017-11-09 DIAGNOSIS — R197 Diarrhea, unspecified: Secondary | ICD-10-CM | POA: Insufficient documentation

## 2017-11-09 DIAGNOSIS — K529 Noninfective gastroenteritis and colitis, unspecified: Secondary | ICD-10-CM | POA: Insufficient documentation

## 2017-11-09 DIAGNOSIS — R1032 Left lower quadrant pain: Secondary | ICD-10-CM | POA: Insufficient documentation

## 2017-11-09 DIAGNOSIS — Z79899 Other long term (current) drug therapy: Secondary | ICD-10-CM | POA: Insufficient documentation

## 2017-11-09 DIAGNOSIS — R4182 Altered mental status, unspecified: Secondary | ICD-10-CM | POA: Diagnosis present

## 2017-11-09 DIAGNOSIS — R110 Nausea: Secondary | ICD-10-CM | POA: Diagnosis not present

## 2017-11-09 LAB — I-STAT TROPONIN, ED: TROPONIN I, POC: 0 ng/mL (ref 0.00–0.08)

## 2017-11-09 LAB — RAPID URINE DRUG SCREEN, HOSP PERFORMED
Amphetamines: NOT DETECTED
Benzodiazepines: NOT DETECTED
COCAINE: NOT DETECTED
OPIATES: NOT DETECTED
Tetrahydrocannabinol: NOT DETECTED

## 2017-11-09 LAB — COMPREHENSIVE METABOLIC PANEL
ALT: 28 U/L (ref 0–44)
AST: 18 U/L (ref 15–41)
Albumin: 3.9 g/dL (ref 3.5–5.0)
Alkaline Phosphatase: 90 U/L (ref 38–126)
Anion gap: 10 (ref 5–15)
BILIRUBIN TOTAL: 1.2 mg/dL (ref 0.3–1.2)
BUN: 14 mg/dL (ref 6–20)
CO2: 25 mmol/L (ref 22–32)
Calcium: 9.8 mg/dL (ref 8.9–10.3)
Chloride: 102 mmol/L (ref 98–111)
Creatinine, Ser: 0.87 mg/dL (ref 0.61–1.24)
GFR calc Af Amer: >60 mL/min (ref >60)
GFR calc non Af Amer: >60 mL/min (ref >60)
Glucose, Bld: 142 mg/dL — ABNORMAL HIGH (ref 70–99)
Potassium: 4.7 mmol/L (ref 3.5–5.1)
SODIUM: 137 mmol/L (ref 135–145)
Total Protein: 7.5 g/dL (ref 6.5–8.1)

## 2017-11-09 LAB — URINALYSIS, ROUTINE W REFLEX MICROSCOPIC
BILIRUBIN URINE: NEGATIVE
Glucose, UA: NEGATIVE mg/dL
KETONES UR: NEGATIVE mg/dL
Nitrite: POSITIVE — AB
PROTEIN: NEGATIVE mg/dL
Specific Gravity, Urine: 1.019 (ref 1.005–1.030)
pH: 5 (ref 5.0–8.0)

## 2017-11-09 LAB — DIFFERENTIAL
Abs Immature Granulocytes: 0.1 10*3/uL (ref 0.0–0.1)
BASOS ABS: 0.1 10*3/uL (ref 0.0–0.1)
BASOS PCT: 0 %
EOS PCT: 1 %
Eosinophils Absolute: 0.1 10*3/uL (ref 0.0–0.7)
Immature Granulocytes: 0 %
Lymphocytes Relative: 11 %
Lymphs Abs: 1.9 10*3/uL (ref 0.7–4.0)
MONO ABS: 0.9 10*3/uL (ref 0.1–1.0)
MONOS PCT: 5 %
NEUTROS PCT: 83 %
Neutro Abs: 14 10*3/uL — ABNORMAL HIGH (ref 1.7–7.7)

## 2017-11-09 LAB — PROTIME-INR
INR: 1.08
Prothrombin Time: 13.9 seconds (ref 11.4–15.2)

## 2017-11-09 LAB — APTT: APTT: 38 s — AB (ref 24–36)

## 2017-11-09 LAB — CBC
HCT: 44.8 % (ref 39.0–52.0)
Hemoglobin: 14.4 g/dL (ref 13.0–17.0)
MCH: 28.7 pg (ref 26.0–34.0)
MCHC: 32.1 g/dL (ref 30.0–36.0)
MCV: 89.2 fL (ref 78.0–100.0)
Platelets: 229 10*3/uL (ref 150–400)
RBC: 5.02 MIL/uL (ref 4.22–5.81)
RDW: 13.6 % (ref 11.5–15.5)
WBC: 17.5 10*3/uL — ABNORMAL HIGH (ref 4.0–10.5)

## 2017-11-09 LAB — CBG MONITORING, ED: GLUCOSE-CAPILLARY: 135 mg/dL — AB (ref 70–99)

## 2017-11-09 LAB — ETHANOL: Alcohol, Ethyl (B): <10 mg/dL (ref <10)

## 2017-11-09 MED ORDER — METRONIDAZOLE 500 MG PO TABS: 500.0000 mg | ORAL_TABLET | Freq: Two times a day (BID) | ORAL | 0 refills | Status: DC

## 2017-11-09 MED ORDER — ONDANSETRON HCL 4 MG/2ML IJ SOLN
4.0000 mg | Freq: Once | INTRAMUSCULAR | Status: AC
  Administered 2017-11-09: 4 mg via INTRAVENOUS
  Filled 2017-11-09: qty 2

## 2017-11-09 MED ORDER — SODIUM CHLORIDE 0.9 % IV BOLUS
1000.0000 mL | Freq: Once | INTRAVENOUS | Status: AC
  Administered 2017-11-09: 1000 mL via INTRAVENOUS

## 2017-11-09 MED ORDER — LORAZEPAM 2 MG/ML IJ SOLN: 1.0000 mg | Freq: Once | INTRAMUSCULAR | Status: DC | PRN

## 2017-11-09 MED ORDER — IOHEXOL 300 MG/ML  SOLN
100.0000 mL | Freq: Once | INTRAMUSCULAR | Status: AC | PRN
  Administered 2017-11-09: 100 mL via INTRAVENOUS

## 2017-11-09 MED ORDER — CEFTRIAXONE SODIUM 250 MG IJ SOLR
250.0000 mg | Freq: Once | INTRAMUSCULAR | Status: AC
  Administered 2017-11-09: 250 mg via INTRAMUSCULAR
  Filled 2017-11-09: qty 250

## 2017-11-09 MED ORDER — LIDOCAINE HCL (PF) 1 % IJ SOLN
INTRAMUSCULAR | Status: AC
  Filled 2017-11-09: qty 5

## 2017-11-09 MED ORDER — DICYCLOMINE HCL 20 MG PO TABS: 20.0000 mg | ORAL_TABLET | Freq: Two times a day (BID) | ORAL | 0 refills | Status: DC

## 2017-11-09 MED ORDER — CIPROFLOXACIN HCL 500 MG PO TABS: 500.0000 mg | ORAL_TABLET | Freq: Two times a day (BID) | ORAL | 0 refills | Status: DC

## 2017-11-09 MED ORDER — ONDANSETRON 4 MG PO TBDP: 4.0000 mg | ORAL_TABLET | Freq: Three times a day (TID) | ORAL | 0 refills | Status: DC | PRN

## 2017-11-09 MED ORDER — AZITHROMYCIN 250 MG PO TABS
1000.0000 mg | ORAL_TABLET | Freq: Once | ORAL | Status: AC
  Administered 2017-11-09: 1000 mg via ORAL
  Filled 2017-11-09: qty 4

## 2017-11-09 NOTE — ED Notes (Signed)
Patient transported to CT 

## 2017-11-09 NOTE — ED Notes (Signed)
ED Provider at bedside. 

## 2017-11-09 NOTE — ED Provider Notes (Signed)
[facility] EMERGENCY DEPARTMENT Provider Note   CSN: [id number] Arrival date & time: 11/09/17  1417     History   Chief Complaint Chief Complaint  Patient presents with  . Altered Mental Status    HPI Author Todd Rose is a 42 y.o. male.  HPI   Todd Rose is a 42 y.o. male, with a history of chronic back pain, presenting to the ED abdominal pain.  Patient endorses left lower quadrant abdominal pain beginning last night.  Pain is intermittent, sharp, 9/10, nonradiating.  Pain will arise, patient has the urge to have a bowel movement, will have an episode of diarrhea, and then pain subsides.  Endorses 10-12 stools over the past 24 hours.  Accompanied by nausea. Two days ago, he had a couple episodes where he "didn't feel quite right," was shivering, but was aware throughout.  Patient additionally endorses some difficulty urinating and dysuria over the past couple days as well as some dribbling a few minutes afterward. He has chronic back pain, but no changes in this recently.   Denies fever, extremity weakness, numbness, difficulty swallowing, shortness of breath, chest pain, vomiting, hematochezia/melena, genital pain or swelling, falls/trauma, headaches, dizziness, or any other complaints.   Past Medical History:  Diagnosis Date  . Back pain     There are no active problems to display for this patient.   Past Surgical History:  Procedure Laterality Date  . CHOLECYSTECTOMY    . KNEE SURGERY    . SHOULDER SURGERY     left        Home Medications    Prior to Admission medications   Medication Sig Start Date End Date Taking? Authorizing Provider  ciprofloxacin (CIPRO) 500 MG tablet Take 1 tablet (500 mg total) by mouth 2 (two) times daily. 11/09/17   Joy, Shawn C, PA-C  cyclobenzaprine (FLEXERIL) 10 MG tablet Take 1 tablet (10 mg total) by mouth 2 (two) times daily as needed for muscle spasms. 04/23/17   McDonald, Mia A, PA-C  dicyclomine (BENTYL) 20 MG  tablet Take 1 tablet (20 mg total) by mouth 2 (two) times daily. 11/09/17   Joy, Shawn C, PA-C  ibuprofen (ADVIL,MOTRIN) 200 MG tablet Take 800 mg by mouth every 6 (six) hours as needed for moderate pain.    [provider]  meloxicam (MOBIC) 15 MG tablet Take 1 tablet (15 mg total) by mouth daily. 04/23/17   McDonald, Mia A, PA-C  methocarbamol (ROBAXIN) 500 MG tablet Take 1 tablet (500 mg total) by mouth 2 (two) times daily. 08/30/13   Sciacca, Marissa, PA-C  metroNIDAZOLE (FLAGYL) 500 MG tablet Take 1 tablet (500 mg total) by mouth 2 (two) times daily. 11/09/17   Joy, Shawn C, PA-C  naproxen (NAPROSYN) 375 MG tablet Take 1 tablet (375 mg total) by mouth 2 (two) times daily. 08/30/13   Sciacca, Marissa, PA-C  ondansetron (ZOFRAN ODT) 4 MG disintegrating tablet Take 1 tablet (4 mg total) by mouth every 8 (eight) hours as needed for nausea or vomiting. 11/09/17   Joy, Shawn C, PA-C  Probiotic Product (PROBIOTIC DAILY) CAPS Take 1 capsule by mouth daily.    [provider]    Family History No family history on file.  Social History Social History   Tobacco Use  . Smoking status: Current Some Day Smoker  . Smokeless tobacco: Never Used  . Tobacco comment: started back on Friday  Substance Use Topics  . Alcohol use: Yes  . Drug use: No  Allergies   Tramadol   Review of Systems Review of Systems  Constitutional: Positive for chills. Negative for diaphoresis and fever.  Eyes: Negative for visual disturbance.  Respiratory: Negative for cough and shortness of breath.   Cardiovascular: Negative for chest pain and leg swelling.  Gastrointestinal: Positive for abdominal pain, diarrhea and nausea. Negative for blood in stool and vomiting.  Genitourinary: Positive for difficulty urinating. Negative for discharge, flank pain, frequency, hematuria, scrotal swelling and testicular pain.  Neurological: Negative for dizziness, light-headedness and headaches.  All other systems  reviewed and are negative.    Physical Exam Updated Vital Signs BP 128/85 (BP Location: Right Arm)   Pulse 95   Temp 98.9 F (37.2 C) (Oral)   Resp 18   SpO2 97%   Physical Exam  Constitutional: He is oriented to person, place, and time. He appears well-developed and well-nourished. No distress.  HENT:  Head: Normocephalic and atraumatic.  Mouth/Throat: Oropharynx is clear and moist.  Eyes: Conjunctivae and EOM are normal.  16m right, 2 mm left. Reactive to light.   Neck: Normal range of motion. Neck supple.  Cardiovascular: Normal rate, regular rhythm, normal heart sounds and intact distal pulses.  Pulmonary/Chest: Effort normal and breath sounds normal. No respiratory distress.  Abdominal: Soft. There is tenderness in the left lower quadrant. There is no guarding.  Musculoskeletal: He exhibits no edema.  Lymphadenopathy:    He has no cervical adenopathy.  Neurological: He is alert and oriented to person, place, and time.  Sensation grossly intact in the extremities. No noted speech deficits. No aphasia. Patient handles oral secretions without difficulty. No noted swallowing defects.  Equal grip strength bilaterally. Strength 5/5 in the upper extremities. Strength 5/5 with flexion and extension of the hips, knees, and ankles bilaterally.  Patellar DTRs 2+ bilaterally. Negative Romberg. No gait disturbance.  Coordination intact including heel to shin and finger to nose.  Cranial nerves III-XII grossly intact.  No facial droop.   Skin: Skin is warm and dry. He is not diaphoretic.  Psychiatric: He has a normal mood and affect. His behavior is normal.  Nursing note and vitals reviewed.    ED Treatments / Results  Labs (all labs ordered are listed, but only abnormal results are displayed) Labs Reviewed  COMPREHENSIVE METABOLIC PANEL - Abnormal; Notable for the following components:      Result Value   Glucose, Bld 142 (*)    All other components within normal limits    CBC - Abnormal; Notable for the following components:   WBC 17.5 (*)    All other components within normal limits  APTT - Abnormal; Notable for the following components:   aPTT 38 (*)    All other components within normal limits  RAPID URINE DRUG SCREEN, HOSP PERFORMED - Abnormal; Notable for the following components:   Barbiturates   (*)    Value: Result not available. Reagent lot number recalled by manufacturer.   All other components within normal limits  URINALYSIS, ROUTINE W REFLEX MICROSCOPIC - Abnormal; Notable for the following components:   APPearance HAZY (*)    Hgb urine dipstick SMALL (*)    Nitrite POSITIVE (*)    Leukocytes, UA LARGE (*)    WBC, UA >50 (*)    Bacteria, UA FEW (*)    All other components within normal limits  DIFFERENTIAL - Abnormal; Notable for the following components:   Neutro Abs 14.0 (*)    All other components within normal limits  CBG  MONITORING, ED - Abnormal; Notable for the following components:   Glucose-Capillary 135 (*)    All other components within normal limits  URINE CULTURE  ETHANOL  PROTIME-INR  HIV ANTIBODY (ROUTINE TESTING)  RPR  I-STAT TROPONIN, ED  GC/CHLAMYDIA PROBE AMP ([place]) NOT AT [facility]    EKG EKG Interpretation  Date/Time:  Sunday November 09 2017 16:34:42 EDT Ventricular Rate:  79 PR Interval:  150 QRS Duration: 92 QT Interval:  348 QTC Calculation: 399 R Axis:   28 Text Interpretation:  Normal sinus rhythm Normal ECG Confirmed by Davonna Belling [phone]) on 11/09/2017 7:40:57 PM   Radiology Ct Head Wo Contrast  Result Date: 11/09/2017 CLINICAL DATA:  Possible seizure 2 days ago. EXAM: CT HEAD WITHOUT CONTRAST TECHNIQUE: Contiguous axial images were obtained from the base of the skull through the vertex without intravenous contrast. COMPARISON:  No subdural, epidural, or subarachnoid hemorrhage. Cerebellum, brainstem, and basal cisterns are normal. Ventricles and sulci are normal. No acute cortical  ischemia or infarct. No mass effect or midline shift. FINDINGS: Brain: Vascular: No hyperdense vessel or unexpected calcification. Skull: Normal. Negative for fracture or focal lesion. Sinuses/Orbits: Polyps or mucous retention cysts are seen in the maxillary sinuses, right greater than left. Paranasal sinuses, mastoid air cells, and middle ears are otherwise normal. Other: None. IMPRESSION: 1. No acute intracranial abnormalities. 2. Polyps or mucous retention cysts in the maxillary sinuses, right greater than left. Electronically Signed   By: Dorise Bullion III M.D   On: 11/09/2017 18:07   Ct Abdomen Pelvis W Contrast  Result Date: 11/09/2017 CLINICAL DATA:  Diarrhea EXAM: CT ABDOMEN AND PELVIS WITH CONTRAST TECHNIQUE: Multidetector CT imaging of the abdomen and pelvis was performed using the standard protocol following bolus administration of intravenous contrast. CONTRAST:  171m OMNIPAQUE IOHEXOL 300 MG/ML  SOLN COMPARISON:  CT 03/28/2013 FINDINGS: Lower chest: No acute abnormality. Hepatobiliary: Steatosis. Status post cholecystectomy. Negative for biliary dilatation. Pancreas: Unremarkable. No pancreatic ductal dilatation or surrounding inflammatory changes. Spleen: Normal in size without focal abnormality. Adrenals/Urinary Tract: Adrenal glands are unremarkable. Kidneys are normal, without renal calculi, focal lesion, or hydronephrosis. Bladder is unremarkable. Stomach/Bowel: Appendix within normal limits. No dilated small bowel. Stomach unremarkable. Possible minimal wall thickening and adjacent edema within the proximal sigmoid colon. Vascular/Lymphatic: No significant vascular findings are present. No enlarged abdominal or pelvic lymph nodes. Reproductive: Prostate is unremarkable. Other: Negative for free air or free fluid. Musculoskeletal: No acute or significant osseous findings. IMPRESSION: 1. Possible mild wall thickening and adjacent edema/mild colitis type changes of the sigmoid colon. 2. There  are no other acute abnormalities visualized 3. Hepatic steatosis. Electronically Signed   By: KDonavan FoilM.D.   On: 11/09/2017 18:32    Procedures Procedures (including critical care time)  Medications Ordered in ED Medications  sodium chloride 0.9 % bolus 1,000 mL (0 mLs Intravenous Stopped 11/09/17 1805)  ondansetron (ZOFRAN) injection 4 mg (4 mg Intravenous Given 11/09/17 1704)  iohexol (OMNIPAQUE) 300 MG/ML solution 100 mL (100 mLs Intravenous Contrast Given 11/09/17 1740)  cefTRIAXone (ROCEPHIN) injection 250 mg (250 mg Intramuscular Given 11/09/17 2018)  azithromycin (ZITHROMAX) tablet 1,000 mg (1,000 mg Oral Given 11/09/17 2017)     Initial Impression / Assessment and Plan / ED Course  I have reviewed the triage vital signs and the nursing notes.  Pertinent labs & imaging results that were available during my care of the patient were reviewed by me and considered in my medical decision making (see chart for  details).  Clinical Course as of Nov 11 114  Nancy Fetter Nov 09, 2017  2022 Patient initially describes some abnormal behavior that occurred 2 days ago.  Based on this account, and the fact that patient states he still "just does not feel right," MRI was ordered.  Prior to MRI being done, patient decided he wanted to leave.  The potential risks of leaving prior to MRI being completed were discussed.  Patient acknowledged these risks and continued to decline. An outpatient order was placed for MRI and patient was instructed to follow up with neurology.   [SJ]    Clinical Course User Index [SJ] Joy, Shawn C, PA-C    Patient presents with abdominal pain and urinary discomfort.  Patient is nontoxic appearing, afebrile, not tachycardic, not tachypneic, not hypotensive, SPO2 of 97% on room air, and is in no apparent distress. Evidence of colitis on CT.  There is evidence of urinary infection on UA.  Patient states it is possible that he could have been exposed to a STD, as his wife has been  unfaithful in the past.   Patient described an odd episode that occurred 2 days ago.  The episode could have been related to his current illness, could have been a stress reaction, or a number of other things.  Patient ultimately declined MRI.  Patient discharged with Cipro and Flagyl.  PCP and neurology follow-up. The patient was given instructions for home care as well as return precautions. Patient voices understanding of these instructions, accepts the plan, and is comfortable with discharge.  Findings and plan of care discussed with Davonna Belling, MD.   Final Clinical Impressions(s) / ED Diagnoses   Final diagnoses:  Acute cystitis with hematuria  Colitis    ED Discharge Orders        Ordered    ciprofloxacin (CIPRO) 500 MG tablet  2 times daily     11/09/17 2023    metroNIDAZOLE (FLAGYL) 500 MG tablet  2 times daily     11/09/17 2023    ondansetron (ZOFRAN ODT) 4 MG disintegrating tablet  Every 8 hours PRN     11/09/17 2023    dicyclomine (BENTYL) 20 MG tablet  2 times daily     11/09/17 2023    MR BRAIN WO CONTRAST     11/09/17 2026       Lorayne Bender, PA-C 11/10/17 0118    Davonna Belling, MD 11/10/17 1556

## 2017-11-09 NOTE — ED Triage Notes (Signed)
Patient to ED c/o feeling out of it following possible seizure on Friday. Patient reports he's had episodes where he "spaces out," but cannot further elaborate on the seizures, does not take medication for them. Patient states he doesn't really know what happened Friday morning, but had a "spacing out" episode with his wife present and reports feeling drunk all day. He adds that he did little work outside yesterday and he felt drained afterward, which is not usual for him. Denies fevers, but endorses chills. No other symptoms. Patient currently A&O x 4, neuro intact.

## 2017-11-09 NOTE — Discharge Instructions (Addendum)
°  There is evidence of inflammation on the CT scan of the abdomen, consistent with a colitis.  There is evidence of an infection in the urine. You have further testing pending.  Positive results will be called to you.  Please take all of your antibiotics until finished!   You may develop abdominal discomfort or diarrhea from the antibiotic.  You may help offset this with probiotics which you can buy or get in yogurt. Do not eat or take the probiotics until 2 hours after your antibiotic.   May take the Zofran for nausea/vomiting.  May take the Bentyl for abdominal discomfort.  Follow-up with neurology as soon as possible on the transient alteration in consciousness.  You have chosen to decline the MRI study against medical advice.  It is recommended that should any other abnormalities occur, you return to the ED.

## 2017-11-10 LAB — HIV ANTIBODY (ROUTINE TESTING W REFLEX): HIV SCREEN 4TH GENERATION: NONREACTIVE

## 2017-11-10 LAB — RPR: RPR Ser Ql: NONREACTIVE

## 2017-11-11 LAB — URINE CULTURE: Culture: >=100000 — AB

## 2017-11-12 ENCOUNTER — Telehealth: Payer: Self-pay | Admitting: *Deleted

## 2017-11-12 NOTE — Telephone Encounter (Signed)
Post ED Visit - Positive Culture Follow-up  Culture report reviewed by antimicrobial stewardship pharmacist:  []  Elenor Quinones, Pharm.D. []  Heide Guile, Pharm.D., BCPS AQ-ID []  Parks Neptune, Pharm.D., BCPS []  Alycia Rossetti, Pharm.D., BCPS []  [place], Pharm.D., BCPS, AAHIVP []  Legrand Como, Pharm.D., BCPS, AAHIVP [x]  Salome Arnt, PharmD, BCPS []  Johnnette Gourd, PharmD, BCPS []  Hughes Better, PharmD, BCPS []  Leeroy Cha, PharmD  Positive urine culture Treated with Ciprofloxacin HCL, organism sensitive to the same and no further patient follow-up is required at this time.  Harlon Flor [facility] 11/12/2017, 9:45 AM

## 2018-02-12 DIAGNOSIS — E119 Type 2 diabetes mellitus without complications: Secondary | ICD-10-CM | POA: Insufficient documentation

## 2018-06-19 ENCOUNTER — Other Ambulatory Visit: Payer: Self-pay

## 2018-06-19 DIAGNOSIS — I739 Peripheral vascular disease, unspecified: Secondary | ICD-10-CM

## 2018-07-21 ENCOUNTER — Encounter: Payer: Self-pay | Admitting: Vascular Surgery

## 2018-07-21 ENCOUNTER — Ambulatory Visit (HOSPITAL_COMMUNITY)
Admission: RE | Admit: 2018-07-21 | Discharge: 2018-07-21 | Disposition: A | Discharge status: Home / Self Care | Payer: Medicare Other | Source: Ambulatory Visit | Attending: Vascular Surgery | Admitting: Vascular Surgery

## 2018-07-21 ENCOUNTER — Ambulatory Visit (INDEPENDENT_AMBULATORY_CARE_PROVIDER_SITE_OTHER): Payer: Medicare Other | Admitting: Vascular Surgery

## 2018-07-21 ENCOUNTER — Other Ambulatory Visit: Payer: Self-pay

## 2018-07-21 VITALS — BP 114/80 | HR 69 | Temp 98.3°F | Resp 16 | Ht 67.0 in | Wt 267.6 lb

## 2018-07-21 DIAGNOSIS — I739 Peripheral vascular disease, unspecified: Secondary | ICD-10-CM

## 2018-07-21 DIAGNOSIS — G4762 Sleep related leg cramps: Secondary | ICD-10-CM | POA: Diagnosis not present

## 2018-07-21 NOTE — Progress Notes (Signed)
Vascular and Vein Specialist of [facility]  Patient name: Todd Rose MRN: [id number] DOB: February 11, 1976 Sex: male  REASON FOR CONSULT: Evaluate lower extremity symptoms to rule out peripheral vascular occlusive disease  HPI: Todd Rose is a 43 y.o. male, who is seen today for discussion of lower extremity symptoms.  He reports symptoms of cramping in both lower extremities.  He reports that this is at night and has been present for years.  This has progressed recently and is more limiting to him.  He also reports occasionally having cramping in his arms.  He does not have any typical calf claudication symptoms with walking.  He is diabetic and does smoke cigarettes.  No history of lower extremity tissue loss  Past Medical History:  Diagnosis Date  . Back pain     History reviewed. No pertinent family history.  SOCIAL HISTORY: Social History   Socioeconomic History  . Marital status: Married    Spouse name: Not on file  . Number of children: Not on file  . Years of education: Not on file  . Highest education level: Not on file  Occupational History  . Not on file  Social Needs  . Financial resource strain: Not on file  . Food insecurity:    Worry: Not on file    Inability: Not on file  . Transportation needs:    Medical: Not on file    Non-medical: Not on file  Tobacco Use  . Smoking status: Current Every Day Smoker  . Smokeless tobacco: Never Used  . Tobacco comment: 1 pk lasts 4-5 days.   Substance and Sexual Activity  . Alcohol use: Yes  . Drug use: No  . Sexual activity: Yes  Lifestyle  . Physical activity:    Days per week: Not on file    Minutes per session: Not on file  . Stress: Not on file  Relationships  . Social connections:    Talks on phone: Not on file    Gets together: Not on file    Attends religious service: Not on file    Active member of club or organization: Not on file    Attends meetings of clubs or  organizations: Not on file    Relationship status: Not on file  . Intimate partner violence:    Fear of current or ex partner: Not on file    Emotionally abused: Not on file    Physically abused: Not on file    Forced sexual activity: Not on file  Other Topics Concern  . Not on file  Social History Narrative  . Not on file    Allergies  Allergen Reactions  . Naproxen   . Tramadol Other (See Comments)    Nausea and headache    Current Outpatient Medications  Medication Sig Dispense Refill  . ibuprofen (ADVIL,MOTRIN) 200 MG tablet Take 800 mg by mouth every 6 (six) hours as needed for moderate pain.    Marland Kitchen lisinopril (PRINIVIL,ZESTRIL) 40 MG tablet     . divalproex (DEPAKOTE) 500 MG DR tablet Take 500 mg by mouth 2 (two) times daily.    Marland Kitchen HYDROcodone-acetaminophen (NORCO) 7.5-325 MG tablet TAKE 2 TABLETS BY MOUTH AT NIGHT    . metFORMIN (GLUCOPHAGE) 500 MG tablet TAKE 1 TABLET BY MOUTH TWICE DAILY. WITH MORNING AND EVENING MEALS.    Marland Kitchen ondansetron (ZOFRAN ODT) 4 MG disintegrating tablet Take 1 tablet (4 mg total) by mouth every 8 (eight) hours as needed for nausea or vomiting. (  Patient not taking: Reported on 07/21/2018) 20 tablet 0   No current facility-administered medications for this visit.     REVIEW OF SYSTEMS:  [X]  denotes positive finding, [ ]  denotes negative finding Cardiac  Comments:  Chest pain or chest pressure:    Shortness of breath upon exertion:    Short of breath when lying flat:    Irregular heart rhythm:        Vascular    Pain in calf, thigh, or hip brought on by ambulation: x   Pain in feet at night that wakes you up from your sleep:  x   Blood clot in your veins:    Leg swelling:  x       Pulmonary    Oxygen at home:    Productive cough:     Wheezing:         Neurologic    Sudden weakness in arms or legs:  x   Sudden numbness in arms or legs:  x   Sudden onset of difficulty speaking or slurred speech:    Temporary loss of vision in one eye:       Problems with dizziness:         Gastrointestinal    Blood in stool:     Vomited blood:         Genitourinary    Burning when urinating:     Blood in urine:        Psychiatric    Major depression:         Hematologic    Bleeding problems:    Problems with blood clotting too easily:        Skin    Rashes or ulcers:        Constitutional    Fever or chills:      PHYSICAL EXAM: Vitals:   07/21/18 1508  BP: 114/80  Pulse: 69  Resp: 16  Temp: 98.3 F (36.8 C)  TempSrc: Oral  SpO2: 95%  Weight: 267 lb 10.2 oz (121.4 kg)  Height: 5' 7"  (1.702 m)    GENERAL: The patient is a well-nourished male, in no acute distress. The vital signs are documented above. CARDIOVASCULAR: Carotid arteries are without bruits bilaterally.  2+ radial pulses bilaterally.  2+ dorsalis pedis pulses bilaterally. PULMONARY: There is good air exchange  ABDOMEN: Soft and non-tender  MUSCULOSKELETAL: There are no major deformities or cyanosis. NEUROLOGIC: No focal weakness or paresthesias are detected. SKIN: There are no ulcers or rashes noted. PSYCHIATRIC: The patient has a normal affect.  DATA:  Noninvasive studies today revealed normal ankle arm index and normal triphasic waveforms in both lower extremities  MEDICAL ISSUES: I discussed these findings with the patient.  I do not see any evidence of arterial insufficiency to explain his symptoms.  This does appear to be more typical nocturnal cramping.  I did discuss the significance of his cigarette smoking related to future risk in light of his diabetes.  He reports that he was able to quit once and I have encouraged him to attempt this again.  He will see [place] again on an as-needed basis   Rosetta Posner, MD [facility] V[facility] and Ve[facility] Specialists of [facility] Tel [phone] Pager [phone]

## 2018-09-04 ENCOUNTER — Emergency Department (HOSPITAL_COMMUNITY): Payer: Medicare Other

## 2018-09-04 ENCOUNTER — Other Ambulatory Visit: Payer: Self-pay

## 2018-09-04 ENCOUNTER — Emergency Department (HOSPITAL_COMMUNITY)
Admission: EM | Admit: 2018-09-04 | Discharge: 2018-09-05 | Discharge status: Against Medical Advice | Payer: Medicare Other | Source: Home / Self Care

## 2018-09-04 ENCOUNTER — Emergency Department (HOSPITAL_COMMUNITY)
Admission: EM | Admit: 2018-09-04 | Discharge: 2018-09-04 | Disposition: A | Discharge status: Home / Self Care | Payer: Medicare Other | Attending: Emergency Medicine | Admitting: Emergency Medicine

## 2018-09-04 ENCOUNTER — Encounter (HOSPITAL_COMMUNITY): Payer: Self-pay | Admitting: Emergency Medicine

## 2018-09-04 DIAGNOSIS — Z7984 Long term (current) use of oral hypoglycemic drugs: Secondary | ICD-10-CM | POA: Insufficient documentation

## 2018-09-04 DIAGNOSIS — F17210 Nicotine dependence, cigarettes, uncomplicated: Secondary | ICD-10-CM | POA: Insufficient documentation

## 2018-09-04 DIAGNOSIS — Z79899 Other long term (current) drug therapy: Secondary | ICD-10-CM | POA: Diagnosis not present

## 2018-09-04 DIAGNOSIS — R197 Diarrhea, unspecified: Secondary | ICD-10-CM | POA: Diagnosis not present

## 2018-09-04 DIAGNOSIS — R1013 Epigastric pain: Secondary | ICD-10-CM | POA: Diagnosis present

## 2018-09-04 DIAGNOSIS — R112 Nausea with vomiting, unspecified: Secondary | ICD-10-CM | POA: Diagnosis not present

## 2018-09-04 DIAGNOSIS — E119 Type 2 diabetes mellitus without complications: Secondary | ICD-10-CM | POA: Insufficient documentation

## 2018-09-04 DIAGNOSIS — Z5321 Procedure and treatment not carried out due to patient leaving prior to being seen by health care provider: Secondary | ICD-10-CM | POA: Insufficient documentation

## 2018-09-04 DIAGNOSIS — I10 Essential (primary) hypertension: Secondary | ICD-10-CM | POA: Diagnosis not present

## 2018-09-04 HISTORY — DX: Essential (primary) hypertension: I10

## 2018-09-04 HISTORY — DX: Type 2 diabetes mellitus without complications: E11.9

## 2018-09-04 LAB — COMPREHENSIVE METABOLIC PANEL
ALT: 30 U/L (ref 0–44)
AST: 20 U/L (ref 15–41)
Albumin: 3.6 g/dL (ref 3.5–5.0)
Alkaline Phosphatase: 75 U/L (ref 38–126)
Anion gap: 13 (ref 5–15)
BUN: 12 mg/dL (ref 6–20)
CO2: 26 mmol/L (ref 22–32)
Calcium: 9.3 mg/dL (ref 8.9–10.3)
Chloride: 100 mmol/L (ref 98–111)
Creatinine, Ser: 0.94 mg/dL (ref 0.61–1.24)
GFR calc Af Amer: >60 mL/min (ref >60)
GFR calc non Af Amer: >60 mL/min (ref >60)
Glucose, Bld: 152 mg/dL — ABNORMAL HIGH (ref 70–99)
Potassium: 3.8 mmol/L (ref 3.5–5.1)
Sodium: 139 mmol/L (ref 135–145)
Total Bilirubin: 0.5 mg/dL (ref 0.3–1.2)
Total Protein: 6.9 g/dL (ref 6.5–8.1)

## 2018-09-04 LAB — CBC WITH DIFFERENTIAL/PLATELET
Abs Immature Granulocytes: 0.1 10*3/uL — ABNORMAL HIGH (ref 0.00–0.07)
Basophils Absolute: 0.1 10*3/uL (ref 0.0–0.1)
Basophils Relative: 1 %
Eosinophils Absolute: 0.1 10*3/uL (ref 0.0–0.5)
Eosinophils Relative: 1 %
HCT: 44 % (ref 39.0–52.0)
Hemoglobin: 14.8 g/dL (ref 13.0–17.0)
Immature Granulocytes: 1 %
Lymphocytes Relative: 19 %
Lymphs Abs: 2.1 10*3/uL (ref 0.7–4.0)
MCH: 29.8 pg (ref 26.0–34.0)
MCHC: 33.6 g/dL (ref 30.0–36.0)
MCV: 88.7 fL (ref 80.0–100.0)
Monocytes Absolute: 1.1 10*3/uL — ABNORMAL HIGH (ref 0.1–1.0)
Monocytes Relative: 10 %
Neutro Abs: 7.5 10*3/uL (ref 1.7–7.7)
Neutrophils Relative %: 68 %
Platelets: 265 10*3/uL (ref 150–400)
RBC: 4.96 MIL/uL (ref 4.22–5.81)
RDW: 13.2 % (ref 11.5–15.5)
WBC: 11 10*3/uL — ABNORMAL HIGH (ref 4.0–10.5)
nRBC: 0 % (ref 0.0–0.2)

## 2018-09-04 LAB — URINALYSIS, ROUTINE W REFLEX MICROSCOPIC
Bilirubin Urine: NEGATIVE
Glucose, UA: NEGATIVE mg/dL
Hgb urine dipstick: NEGATIVE
Ketones, ur: NEGATIVE mg/dL
Leukocytes,Ua: NEGATIVE
Nitrite: NEGATIVE
Protein, ur: NEGATIVE mg/dL
Specific Gravity, Urine: >1.046 — ABNORMAL HIGH (ref 1.005–1.030)
pH: 6 (ref 5.0–8.0)

## 2018-09-04 LAB — RAPID URINE DRUG SCREEN, HOSP PERFORMED
Amphetamines: NOT DETECTED
Barbiturates: NOT DETECTED
Benzodiazepines: NOT DETECTED
Cocaine: NOT DETECTED
Opiates: POSITIVE — AB
Tetrahydrocannabinol: POSITIVE — AB

## 2018-09-04 LAB — LIPASE, BLOOD: Lipase: 35 U/L (ref 11–51)

## 2018-09-04 MED ORDER — CAPSAICIN 0.025 % EX CREA
TOPICAL_CREAM | Freq: Once | CUTANEOUS | Status: AC
  Administered 2018-09-04: 14:00:00 via TOPICAL
  Filled 2018-09-04: qty 60

## 2018-09-04 MED ORDER — SODIUM CHLORIDE 0.9 % IV BOLUS (SEPSIS)
1000.0000 mL | Freq: Once | INTRAVENOUS | Status: AC
  Administered 2018-09-04: 10:00:00 1000 mL via INTRAVENOUS

## 2018-09-04 MED ORDER — PANTOPRAZOLE SODIUM 40 MG IV SOLR
40.0000 mg | Freq: Once | INTRAVENOUS | Status: AC
  Administered 2018-09-04: 40 mg via INTRAVENOUS
  Filled 2018-09-04: qty 40

## 2018-09-04 MED ORDER — IOHEXOL 300 MG/ML  SOLN
100.0000 mL | Freq: Once | INTRAMUSCULAR | Status: AC | PRN
  Administered 2018-09-04: 100 mL via INTRAVENOUS

## 2018-09-04 MED ORDER — PROMETHAZINE HCL 25 MG PO TABS: 25.0000 mg | ORAL_TABLET | Freq: Four times a day (QID) | ORAL | 0 refills | Status: DC | PRN

## 2018-09-04 MED ORDER — MORPHINE SULFATE (PF) 4 MG/ML IV SOLN
4.0000 mg | Freq: Once | INTRAVENOUS | Status: AC
  Administered 2018-09-04: 13:00:00 4 mg via INTRAVENOUS
  Filled 2018-09-04: qty 1

## 2018-09-04 MED ORDER — PROMETHAZINE HCL 25 MG/ML IJ SOLN
25.0000 mg | Freq: Once | INTRAMUSCULAR | Status: AC
  Administered 2018-09-04: 25 mg via INTRAVENOUS
  Filled 2018-09-04: qty 1

## 2018-09-04 MED ORDER — ONDANSETRON HCL 4 MG/2ML IJ SOLN
4.0000 mg | Freq: Once | INTRAMUSCULAR | Status: AC
  Administered 2018-09-04: 4 mg via INTRAVENOUS
  Filled 2018-09-04: qty 2

## 2018-09-04 MED ORDER — SODIUM CHLORIDE 0.9 % IV BOLUS (SEPSIS)
1000.0000 mL | Freq: Once | INTRAVENOUS | Status: AC
  Administered 2018-09-04: 1000 mL via INTRAVENOUS

## 2018-09-04 NOTE — ED Provider Notes (Signed)
[facility] EMERGENCY DEPARTMENT Provider Note   CSN: [id number] Arrival date & time: 09/04/18  5625    History   Chief Complaint Chief Complaint  Patient presents with   Abdominal Pain    HPI Todd Rose is a 43 y.o. male.     Patient is a 43 year old obese male with past medical history of hypertension, diabetes who presents the emergency department for epigastric pain, nausea, vomiting.  Patient reports that he has had similar instances of the same in the past but that this time it feels more intense and has been lasting for much longer.  It is described as a cramping sensation which is severe.  Reports that it started about 3 weeks ago.  He did talk to his primary care doctor via telemedicine and was prescribed omeprazole and Carafate which she has been taking for about 1 week but is not helping.  Reports that the pain is usually pretty constant but sometimes goes away.  Is definitely worse after eating anything.  Reports that he has had multiple episodes of vomiting which is yellow in color especially after eating.  Denies any fever, chills.  Also reports that he has had a lot of loose stools.  Denies any melena, hematochezia.  Reports that his diabetes is controlled.  Denies any chest pain, shortness of breath     Past Medical History:  Diagnosis Date   Back pain    Diabetes mellitus without complication ([place])    Hypertension     There are no active problems to display for this patient.   Past Surgical History:  Procedure Laterality Date   CHOLECYSTECTOMY     KNEE SURGERY     SHOULDER SURGERY     left        Home Medications    Prior to Admission medications   Medication Sig Start Date End Date Taking? Authorizing Provider  divalproex (DEPAKOTE) 500 MG DR tablet Take 500 mg by mouth 2 (two) times daily. 07/14/18   [provider]  HYDROcodone-acetaminophen (NORCO) 7.5-325 MG tablet TAKE 2 TABLETS BY MOUTH AT NIGHT 07/15/18    [provider]  ibuprofen (ADVIL,MOTRIN) 200 MG tablet Take 800 mg by mouth every 6 (six) hours as needed for moderate pain.    [provider]  lisinopril (PRINIVIL,ZESTRIL) 40 MG tablet  12/30/17   [provider]  metFORMIN (GLUCOPHAGE) 500 MG tablet TAKE 1 TABLET BY MOUTH TWICE DAILY. WITH MORNING AND EVENING MEALS. 06/10/18   [provider]  ondansetron (ZOFRAN ODT) 4 MG disintegrating tablet Take 1 tablet (4 mg total) by mouth every 8 (eight) hours as needed for nausea or vomiting. Patient not taking: Reported on 07/21/2018 11/09/17   Lorayne Bender, PA-C  promethazine (PHENERGAN) 25 MG tablet Take 1 tablet (25 mg total) by mouth every 6 (six) hours as needed for nausea or vomiting. 09/04/18   Alveria Apley, PA-C    Family History No family history on file.  Social History Social History   Tobacco Use   Smoking status: Current Every Day Smoker   Smokeless tobacco: Never Used   Tobacco comment: 1 pk lasts 4-5 days.   Substance Use Topics   Alcohol use: Yes   Drug use: No     Allergies   Naproxen and Tramadol   Review of Systems Review of Systems  Constitutional: Positive for appetite change. Negative for chills and fever.  HENT: Negative for ear pain and sore throat.   Eyes: Negative  for pain and visual disturbance.  Respiratory: Negative for cough and shortness of breath.   Cardiovascular: Negative for chest pain and palpitations.  Gastrointestinal: Positive for diarrhea, nausea and vomiting. Negative for abdominal pain.  Genitourinary: Negative for dysuria and hematuria.  Musculoskeletal: Negative for arthralgias and back pain.  Skin: Negative for color change and rash.  Neurological: Negative for seizures and syncope.  All other systems reviewed and are negative.    Physical Exam Updated Vital Signs BP 108/83    Pulse 85    Temp 98.9 F (37.2 C) (Oral)    Resp 18    SpO2 96%   Physical Exam Vitals signs and nursing note  reviewed.  Constitutional:      Appearance: He is well-developed.  HENT:     Head: Normocephalic and atraumatic.  Eyes:     Conjunctiva/sclera: Conjunctivae normal.  Neck:     Musculoskeletal: Neck supple.  Cardiovascular:     Rate and Rhythm: Normal rate and regular rhythm.     Heart sounds: No murmur.  Pulmonary:     Effort: Pulmonary effort is normal. No respiratory distress.     Breath sounds: Normal breath sounds.  Abdominal:     General: Abdomen is protuberant.     Palpations: Abdomen is soft.     Tenderness: There is abdominal tenderness in the epigastric area. There is no right CVA tenderness, left CVA tenderness, guarding or rebound. Negative signs include Murphy's sign, Rovsing's sign and McBurney's sign.  Skin:    General: Skin is warm and dry.  Neurological:     Mental Status: He is alert.  Psychiatric:        Mood and Affect: Mood normal.      ED Treatments / Results  Labs (all labs ordered are listed, but only abnormal results are displayed) Labs Reviewed  CBC WITH DIFFERENTIAL/PLATELET - Abnormal; Notable for the following components:      Result Value   WBC 11.0 (*)    Monocytes Absolute 1.1 (*)    Abs Immature Granulocytes 0.10 (*)    All other components within normal limits  COMPREHENSIVE METABOLIC PANEL - Abnormal; Notable for the following components:   Glucose, Bld 152 (*)    All other components within normal limits  URINALYSIS, ROUTINE W REFLEX MICROSCOPIC - Abnormal; Notable for the following components:   Specific Gravity, Urine >1.046 (*)    All other components within normal limits  RAPID URINE DRUG SCREEN, HOSP PERFORMED - Abnormal; Notable for the following components:   Opiates POSITIVE (*)    Tetrahydrocannabinol POSITIVE (*)    All other components within normal limits  LIPASE, BLOOD    EKG EKG Interpretation  Date/Time:  Friday September 04 2018 12:51:27 EDT Ventricular Rate:  90 PR Interval:    QRS Duration: 93 QT  Interval:  352 QTC Calculation: 431 R Axis:   20 Text Interpretation:  Sinus rhythm No significant change since last tracing Confirmed by Wandra Arthurs [phone]) on 09/04/2018 12:55:06 PM   Radiology Ct Abdomen Pelvis W Contrast  Result Date: 09/04/2018 CLINICAL DATA:  Epigastric region pain with nausea and vomiting EXAM: CT ABDOMEN AND PELVIS WITH CONTRAST TECHNIQUE: Multidetector CT imaging of the abdomen and pelvis was performed using the standard protocol following bolus administration of intravenous contrast. CONTRAST:  119m OMNIPAQUE IOHEXOL 300 MG/ML  SOLN COMPARISON:  March 18, 2018. FINDINGS: Lower chest: Lung bases are clear. Hepatobiliary: There is hepatic steatosis. No focal liver lesions are appreciable. The gallbladder is  absent. There is no biliary duct dilatation. Pancreas: There is no pancreatic mass or inflammatory focus. Spleen: No splenic lesions are evident. Adrenals/Urinary Tract: Adrenals bilaterally appear unremarkable. Kidneys bilaterally show no evident mass or hydronephrosis on either side. There is no appreciable renal or ureteral calculus on either side. Urinary bladder is midline with wall thickness within normal limits. Stomach/Bowel: There is no appreciable bowel wall or mesenteric thickening. No evident bowel obstruction. There is no appreciable free air or portal venous air. Vascular/Lymphatic: There is no abdominal aortic aneurysm. No vascular lesions are evident. There is no adenopathy in the abdomen or pelvis. Reproductive: Prostate and seminal vesicles are normal in size and contour. A tiny calcification is noted in the prostate. Other: Appendix appears normal. There is no evident abscess or ascites in the abdomen or pelvis. Musculoskeletal: There is moderately severe spinal stenosis at L4-5 due to diffuse disc protrusion and bony hypertrophy. There are no blastic or lytic bone lesions. No intramuscular lesions are evident. IMPRESSION: 1. Moderately severe spinal  stenosis at L4-5, multifactorial in etiology. 2. No evident bowel obstruction. No abscess in the abdomen or pelvis. Appendix appears normal. 3. No renal or ureteral calculus. No hydronephrosis. Urinary bladder wall thickness within normal limits. 4.  Hepatic steatosis. 5.  Gallbladder absent. Electronically Signed   By: Lowella Grip III M.D.   On: 09/04/2018 12:34    Procedures Procedures (including critical care time)  Medications Ordered in ED Medications  sodium chloride 0.9 % bolus 1,000 mL (1,000 mLs Intravenous New Bag/Given 09/04/18 1405)  ondansetron (ZOFRAN) injection 4 mg (4 mg Intravenous Given 09/04/18 1015)  pantoprazole (PROTONIX) injection 40 mg (40 mg Intravenous Given 09/04/18 1015)  sodium chloride 0.9 % bolus 1,000 mL (0 mLs Intravenous Stopped 09/04/18 1232)  iohexol (OMNIPAQUE) 300 MG/ML solution 100 mL (100 mLs Intravenous Contrast Given 09/04/18 1210)  ondansetron (ZOFRAN) injection 4 mg (4 mg Intravenous Given 09/04/18 1232)  morphine 4 MG/ML injection 4 mg (4 mg Intravenous Given 09/04/18 1237)  capsaicin (ZOSTRIX) 0.025 % cream ( Topical Given 09/04/18 1407)  promethazine (PHENERGAN) injection 25 mg (25 mg Intravenous Given 09/04/18 1408)     Initial Impression / Assessment and Plan / ED Course  I have reviewed the triage vital signs and the nursing notes.  Pertinent labs & imaging results that were available during my care of the patient were reviewed by me and considered in my medical decision making (see chart for details).  Clinical Course as of Sep 04 1451  Fri Sep 04, 2018  1326 Patient's work-up essentially unremarkable including CBC, CMP, lipase, CT scan.  Patient may be having THC hyperemesis, or gastroparesis.  We will trial Phenergan, additional fluids, capsaicin cream,   [KM]  1450 Patient improved with phenergan, sleeping comfortably. Will have him continue with his zofran, carafate and start phenergan   [KM]    Clinical Course User Index [KM]  Alveria Apley, PA-C       Based on review of vitals, medical screening exam, lab work and/or imaging, there does not appear to be an acute, emergent etiology for the patient's symptoms. Counseled pt on good return precautions and encouraged both PCP and ED follow-up as needed.  Prior to discharge, I also discussed incidental imaging findings with patient in detail and advised appropriate, recommended follow-up in detail.  Clinical Impression: 1. Epigastric pain   2. Nausea and vomiting, intractability of vomiting not specified, unspecified vomiting type     Disposition: Discharge  Prior to providing  a prescription for a controlled substance, I independently reviewed the patient's recent prescription history on the [place]. The patient had no recent or regular prescriptions and was deemed appropriate for a brief, less than 3 day prescription of narcotic for acute analgesia.  This note was prepared with assistance of Systems analyst. Occasional wrong-word or sound-a-like substitutions may have occurred due to the inherent limitations of voice recognition software.   Final Clinical Impressions(s) / ED Diagnoses   Final diagnoses:  Epigastric pain  Nausea and vomiting, intractability of vomiting not specified, unspecified vomiting type    ED Discharge Orders         Ordered    promethazine (PHENERGAN) 25 MG tablet  Every 6 hours PRN     09/04/18 1452           Kristine Royal 09/04/18 1453    Davonna Belling, MD 09/04/18 1552

## 2018-09-04 NOTE — ED Notes (Signed)
ED Provider at bedside. 

## 2018-09-04 NOTE — Discharge Instructions (Signed)
Your lab work, CT scan were normal.  You will need to follow-up with GI for more investigational studies.  Avoid smoking marijuana as sometimes this can exacerbate symptoms.  Continue to take the medication prescribed by your primary care doctor and in addition please take the new prescriptions from the emergency department.

## 2018-09-04 NOTE — ED Triage Notes (Signed)
Patient reports epigastric pain x3 weeks that begins about 10 mins after eating, he reports that he had his gallbladder removed a few years ago and the pain feels very similar to the pain he had prior to that. He reports he did an evisit with his pcp and was given omeprazole and carafate which he has been taking x1 week without relief.

## 2018-09-05 ENCOUNTER — Encounter (HOSPITAL_COMMUNITY): Payer: Self-pay

## 2018-09-05 ENCOUNTER — Other Ambulatory Visit: Payer: Self-pay

## 2018-09-05 LAB — URINALYSIS, ROUTINE W REFLEX MICROSCOPIC
Bilirubin Urine: NEGATIVE
Glucose, UA: NEGATIVE mg/dL
Hgb urine dipstick: NEGATIVE
Ketones, ur: 80 mg/dL — AB
Leukocytes,Ua: NEGATIVE
Nitrite: NEGATIVE
Protein, ur: 30 mg/dL — AB
Specific Gravity, Urine: >1.046 — ABNORMAL HIGH (ref 1.005–1.030)
pH: 5 (ref 5.0–8.0)

## 2018-09-05 LAB — COMPREHENSIVE METABOLIC PANEL
ALT: 28 U/L (ref 0–44)
AST: 20 U/L (ref 15–41)
Albumin: 3.6 g/dL (ref 3.5–5.0)
Alkaline Phosphatase: 73 U/L (ref 38–126)
Anion gap: 13 (ref 5–15)
BUN: 12 mg/dL (ref 6–20)
CO2: 26 mmol/L (ref 22–32)
Calcium: 8.9 mg/dL (ref 8.9–10.3)
Chloride: 100 mmol/L (ref 98–111)
Creatinine, Ser: 1.05 mg/dL (ref 0.61–1.24)
GFR calc Af Amer: >60 mL/min (ref >60)
GFR calc non Af Amer: >60 mL/min (ref >60)
Glucose, Bld: 113 mg/dL — ABNORMAL HIGH (ref 70–99)
Potassium: 3.8 mmol/L (ref 3.5–5.1)
Sodium: 139 mmol/L (ref 135–145)
Total Bilirubin: 0.6 mg/dL (ref 0.3–1.2)
Total Protein: 6.9 g/dL (ref 6.5–8.1)

## 2018-09-05 LAB — CBC
HCT: 42.3 % (ref 39.0–52.0)
Hemoglobin: 14.1 g/dL (ref 13.0–17.0)
MCH: 29.6 pg (ref 26.0–34.0)
MCHC: 33.3 g/dL (ref 30.0–36.0)
MCV: 88.9 fL (ref 80.0–100.0)
Platelets: 269 10*3/uL (ref 150–400)
RBC: 4.76 MIL/uL (ref 4.22–5.81)
RDW: 13.2 % (ref 11.5–15.5)
WBC: 9.4 10*3/uL (ref 4.0–10.5)
nRBC: 0 % (ref 0.0–0.2)

## 2018-09-05 LAB — LIPASE, BLOOD: Lipase: 27 U/L (ref 11–51)

## 2018-09-05 MED ORDER — ONDANSETRON 4 MG PO TBDP
4.0000 mg | ORAL_TABLET | Freq: Once | ORAL | Status: AC | PRN
  Administered 2018-09-05: 4 mg via ORAL
  Filled 2018-09-05: qty 1

## 2018-09-05 MED ORDER — SODIUM CHLORIDE 0.9% FLUSH: 3.0000 mL | Freq: Once | INTRAVENOUS | Status: DC

## 2018-09-05 NOTE — ED Notes (Signed)
Pt decided not to stay, patient encouraged to wait and see EDP.  Pt declined and wanted to leave.  Pt ambulated to exit with steady gait

## 2018-09-05 NOTE — ED Triage Notes (Signed)
Pt here for upper abd pain for last 3 weeks.  Pt here yesterday for the same.  Pain still there and unchanged from previous visit. A&Ox4.

## 2018-10-03 ENCOUNTER — Other Ambulatory Visit: Payer: Self-pay

## 2018-10-03 ENCOUNTER — Emergency Department (HOSPITAL_COMMUNITY)
Admission: EM | Admit: 2018-10-03 | Discharge: 2018-10-04 | Disposition: A | Discharge status: Home / Self Care | Payer: Medicare Other | Attending: Emergency Medicine | Admitting: Emergency Medicine

## 2018-10-03 ENCOUNTER — Emergency Department (HOSPITAL_COMMUNITY): Payer: Medicare Other

## 2018-10-03 ENCOUNTER — Encounter (HOSPITAL_COMMUNITY): Payer: Self-pay | Admitting: Emergency Medicine

## 2018-10-03 DIAGNOSIS — F17210 Nicotine dependence, cigarettes, uncomplicated: Secondary | ICD-10-CM | POA: Insufficient documentation

## 2018-10-03 DIAGNOSIS — I10 Essential (primary) hypertension: Secondary | ICD-10-CM | POA: Insufficient documentation

## 2018-10-03 DIAGNOSIS — Y929 Unspecified place or not applicable: Secondary | ICD-10-CM | POA: Diagnosis not present

## 2018-10-03 DIAGNOSIS — S99921A Unspecified injury of right foot, initial encounter: Secondary | ICD-10-CM | POA: Diagnosis present

## 2018-10-03 DIAGNOSIS — Z79899 Other long term (current) drug therapy: Secondary | ICD-10-CM | POA: Insufficient documentation

## 2018-10-03 DIAGNOSIS — S92901B Unspecified fracture of right foot, initial encounter for open fracture: Secondary | ICD-10-CM | POA: Diagnosis not present

## 2018-10-03 DIAGNOSIS — Y9389 Activity, other specified: Secondary | ICD-10-CM | POA: Diagnosis not present

## 2018-10-03 DIAGNOSIS — Z7984 Long term (current) use of oral hypoglycemic drugs: Secondary | ICD-10-CM | POA: Insufficient documentation

## 2018-10-03 DIAGNOSIS — E119 Type 2 diabetes mellitus without complications: Secondary | ICD-10-CM | POA: Diagnosis not present

## 2018-10-03 DIAGNOSIS — W3400XA Accidental discharge from unspecified firearms or gun, initial encounter: Secondary | ICD-10-CM

## 2018-10-03 DIAGNOSIS — Z23 Encounter for immunization: Secondary | ICD-10-CM | POA: Diagnosis not present

## 2018-10-03 DIAGNOSIS — Y998 Other external cause status: Secondary | ICD-10-CM | POA: Diagnosis not present

## 2018-10-03 DIAGNOSIS — W3409XA Accidental discharge from other specified firearms, initial encounter: Secondary | ICD-10-CM | POA: Insufficient documentation

## 2018-10-03 MED ORDER — TETANUS-DIPHTH-ACELL PERTUSSIS 5-2.5-18.5 LF-MCG/0.5 IM SUSP
0.5000 mL | Freq: Once | INTRAMUSCULAR | Status: AC
  Administered 2018-10-03: 0.5 mL via INTRAMUSCULAR
  Filled 2018-10-03: qty 0.5

## 2018-10-03 MED ORDER — ONDANSETRON HCL 4 MG/2ML IJ SOLN
4.0000 mg | Freq: Once | INTRAMUSCULAR | Status: AC
  Administered 2018-10-03: 4 mg via INTRAVENOUS
  Filled 2018-10-03: qty 2

## 2018-10-03 MED ORDER — CEFAZOLIN SODIUM-DEXTROSE 2-4 GM/100ML-% IV SOLN
2.0000 g | Freq: Once | INTRAVENOUS | Status: AC
  Administered 2018-10-03: 2 g via INTRAVENOUS
  Filled 2018-10-03: qty 100

## 2018-10-03 MED ORDER — HYDROMORPHONE HCL 1 MG/ML IJ SOLN
1.0000 mg | Freq: Once | INTRAMUSCULAR | Status: AC
  Administered 2018-10-03: 1 mg via INTRAVENOUS
  Filled 2018-10-03: qty 1

## 2018-10-03 MED ORDER — HYDROCODONE-ACETAMINOPHEN 5-325 MG PO TABS: 1.0000 | ORAL_TABLET | Freq: Four times a day (QID) | ORAL | 0 refills | Status: DC | PRN

## 2018-10-03 MED ORDER — BACITRACIN ZINC 500 UNIT/GM EX OINT
1.0000 "application " | TOPICAL_OINTMENT | Freq: Two times a day (BID) | CUTANEOUS | Status: DC
  Administered 2018-10-03: 1 via TOPICAL
  Filled 2018-10-03: qty 0.9

## 2018-10-03 MED ORDER — HYDROCODONE-ACETAMINOPHEN 5-325 MG PO TABS
2.0000 | ORAL_TABLET | Freq: Once | ORAL | Status: AC
  Administered 2018-10-03: 2 via ORAL
  Filled 2018-10-03: qty 2

## 2018-10-03 NOTE — ED Notes (Signed)
RN assisted provider in unwrapping foot.  Bleeding controlled.  Cleaned wounds.  Pt has one entry wound above big toe exit wound at the tip of toe.
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
## 2018-10-03 NOTE — Discharge Instructions (Addendum)
Your need to keep you foot elevated.  Do not put any weight on it.  Take antibiotics and pain medication as directed.  Follow-up with the orthopedic doctor listed.

## 2018-10-03 NOTE — ED Notes (Signed)
Secretary to page ortho tech. Pt notified of plan of care.
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
## 2018-10-06 ENCOUNTER — Emergency Department (HOSPITAL_COMMUNITY)
Admission: EM | Admit: 2018-10-06 | Discharge: 2018-10-06 | Disposition: A | Discharge status: Home / Self Care | Payer: Medicare Other | Attending: Emergency Medicine | Admitting: Emergency Medicine

## 2018-10-06 ENCOUNTER — Encounter (HOSPITAL_COMMUNITY): Payer: Self-pay | Admitting: Emergency Medicine

## 2018-10-06 ENCOUNTER — Other Ambulatory Visit: Payer: Self-pay

## 2018-10-06 DIAGNOSIS — Z5321 Procedure and treatment not carried out due to patient leaving prior to being seen by health care provider: Secondary | ICD-10-CM | POA: Diagnosis not present

## 2018-10-06 DIAGNOSIS — Z09 Encounter for follow-up examination after completed treatment for conditions other than malignant neoplasm: Secondary | ICD-10-CM | POA: Diagnosis present
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
## 2018-10-06 NOTE — ED Notes (Signed)
Nurse Navigator communication: The patient wishes to leave the ER at this time. He was encouraged to stay, he reports that he will go to his appointment tomorrow.

## 2018-10-23 DIAGNOSIS — M25571 Pain in right ankle and joints of right foot: Secondary | ICD-10-CM | POA: Diagnosis not present

## 2018-11-23 DIAGNOSIS — I10 Essential (primary) hypertension: Secondary | ICD-10-CM | POA: Diagnosis not present

## 2018-11-23 DIAGNOSIS — E1151 Type 2 diabetes mellitus with diabetic peripheral angiopathy without gangrene: Secondary | ICD-10-CM | POA: Diagnosis not present

## 2018-11-23 DIAGNOSIS — S062X9D Diffuse traumatic brain injury with loss of consciousness of unspecified duration, subsequent encounter: Secondary | ICD-10-CM | POA: Diagnosis not present

## 2018-11-23 DIAGNOSIS — G603 Idiopathic progressive neuropathy: Secondary | ICD-10-CM | POA: Diagnosis not present

## 2018-12-14 DIAGNOSIS — Z1159 Encounter for screening for other viral diseases: Secondary | ICD-10-CM | POA: Diagnosis not present

## 2018-12-14 DIAGNOSIS — R51 Headache: Secondary | ICD-10-CM | POA: Diagnosis not present

## 2019-02-16 DIAGNOSIS — Z0000 Encounter for general adult medical examination without abnormal findings: Secondary | ICD-10-CM | POA: Diagnosis not present

## 2019-02-16 DIAGNOSIS — Z1159 Encounter for screening for other viral diseases: Secondary | ICD-10-CM | POA: Diagnosis not present

## 2019-04-07 DIAGNOSIS — E782 Mixed hyperlipidemia: Secondary | ICD-10-CM | POA: Diagnosis not present

## 2019-04-07 DIAGNOSIS — E1169 Type 2 diabetes mellitus with other specified complication: Secondary | ICD-10-CM | POA: Diagnosis not present

## 2019-04-07 DIAGNOSIS — E1151 Type 2 diabetes mellitus with diabetic peripheral angiopathy without gangrene: Secondary | ICD-10-CM | POA: Diagnosis not present

## 2019-04-07 DIAGNOSIS — G603 Idiopathic progressive neuropathy: Secondary | ICD-10-CM | POA: Diagnosis not present

## 2019-04-07 DIAGNOSIS — I10 Essential (primary) hypertension: Secondary | ICD-10-CM | POA: Diagnosis not present

## 2019-04-13 DIAGNOSIS — K2900 Acute gastritis without bleeding: Secondary | ICD-10-CM | POA: Diagnosis not present

## 2019-04-13 DIAGNOSIS — M545 Low back pain: Secondary | ICD-10-CM | POA: Diagnosis not present

## 2019-04-13 DIAGNOSIS — Z1159 Encounter for screening for other viral diseases: Secondary | ICD-10-CM | POA: Diagnosis not present

## 2019-04-21 DIAGNOSIS — M1711 Unilateral primary osteoarthritis, right knee: Secondary | ICD-10-CM | POA: Diagnosis not present

## 2019-04-28 DIAGNOSIS — M25511 Pain in right shoulder: Secondary | ICD-10-CM | POA: Diagnosis not present

## 2019-04-29 DIAGNOSIS — G8929 Other chronic pain: Secondary | ICD-10-CM | POA: Diagnosis not present

## 2019-04-29 DIAGNOSIS — M5116 Intervertebral disc disorders with radiculopathy, lumbar region: Secondary | ICD-10-CM | POA: Diagnosis not present

## 2019-04-29 DIAGNOSIS — M5136 Other intervertebral disc degeneration, lumbar region: Secondary | ICD-10-CM | POA: Insufficient documentation

## 2019-04-29 DIAGNOSIS — M48062 Spinal stenosis, lumbar region with neurogenic claudication: Secondary | ICD-10-CM | POA: Insufficient documentation

## 2019-04-29 DIAGNOSIS — M5416 Radiculopathy, lumbar region: Secondary | ICD-10-CM | POA: Insufficient documentation

## 2019-04-29 DIAGNOSIS — M549 Dorsalgia, unspecified: Secondary | ICD-10-CM | POA: Diagnosis not present

## 2019-04-29 DIAGNOSIS — M51369 Other intervertebral disc degeneration, lumbar region without mention of lumbar back pain or lower extremity pain: Secondary | ICD-10-CM

## 2019-04-29 DIAGNOSIS — M4726 Other spondylosis with radiculopathy, lumbar region: Secondary | ICD-10-CM | POA: Diagnosis not present

## 2019-04-29 HISTORY — DX: Radiculopathy, lumbar region: M54.16

## 2019-04-29 HISTORY — DX: Other intervertebral disc degeneration, lumbar region: M51.36

## 2019-04-29 HISTORY — DX: Spinal stenosis, lumbar region with neurogenic claudication: M48.062

## 2019-04-29 HISTORY — DX: Other intervertebral disc degeneration, lumbar region without mention of lumbar back pain or lower extremity pain: M51.369

## 2019-04-30 ENCOUNTER — Other Ambulatory Visit: Payer: Self-pay

## 2019-04-30 NOTE — Patient Outreach (Signed)
[place] [facility]) Care Management  04/30/2019  Todd Rose 05-17-1975 [id number]   Medication Adherence call to Mr. Todd Rose Hippa Identifiers Verify spoke with patient he is past due on Metformin 500 mg,patient explain he has stop taking this medication per doctors instruction he explain his A1c is low reason why they stop the medication. Todd Rose is showing past due under [place].   [place] Management Direct Dial [phone]  Fax [phone] [email or web address]

## 2019-05-17 ENCOUNTER — Other Ambulatory Visit: Payer: Self-pay

## 2019-05-17 NOTE — Patient Outreach (Signed)
[place] [facility]) Care Management  05/17/2019  Todd Rose 1975/10/29 [id number]   Medication Adherence call to Todd Rose Telephone call to Patient regarding Medication Adherence unable to reach patient. [name] is showing past due on Metformin 500 mg under [place].   [place] Management Direct Dial [phone]  Fax [phone] [email or web address]

## 2019-05-27 DIAGNOSIS — G8929 Other chronic pain: Secondary | ICD-10-CM | POA: Diagnosis not present

## 2019-05-27 DIAGNOSIS — M5136 Other intervertebral disc degeneration, lumbar region: Secondary | ICD-10-CM | POA: Diagnosis not present

## 2019-05-27 DIAGNOSIS — Z79891 Long term (current) use of opiate analgesic: Secondary | ICD-10-CM | POA: Diagnosis not present

## 2019-05-27 DIAGNOSIS — M48062 Spinal stenosis, lumbar region with neurogenic claudication: Secondary | ICD-10-CM | POA: Diagnosis not present

## 2019-05-27 DIAGNOSIS — Z0289 Encounter for other administrative examinations: Secondary | ICD-10-CM

## 2019-05-27 DIAGNOSIS — M5116 Intervertebral disc disorders with radiculopathy, lumbar region: Secondary | ICD-10-CM | POA: Diagnosis not present

## 2019-05-27 HISTORY — DX: Encounter for other administrative examinations: Z02.89

## 2019-06-04 DIAGNOSIS — M5416 Radiculopathy, lumbar region: Secondary | ICD-10-CM | POA: Diagnosis not present

## 2019-06-04 DIAGNOSIS — M5136 Other intervertebral disc degeneration, lumbar region: Secondary | ICD-10-CM | POA: Diagnosis not present

## 2019-06-04 DIAGNOSIS — M5127 Other intervertebral disc displacement, lumbosacral region: Secondary | ICD-10-CM | POA: Diagnosis not present

## 2019-06-04 DIAGNOSIS — M5126 Other intervertebral disc displacement, lumbar region: Secondary | ICD-10-CM | POA: Diagnosis not present

## 2019-06-04 DIAGNOSIS — M48062 Spinal stenosis, lumbar region with neurogenic claudication: Secondary | ICD-10-CM | POA: Diagnosis not present

## 2019-06-08 DIAGNOSIS — M4600 Spinal enthesopathy, site unspecified: Secondary | ICD-10-CM | POA: Diagnosis not present

## 2019-06-08 DIAGNOSIS — M5416 Radiculopathy, lumbar region: Secondary | ICD-10-CM | POA: Diagnosis not present

## 2019-06-08 DIAGNOSIS — M545 Low back pain: Secondary | ICD-10-CM | POA: Diagnosis not present

## 2019-06-08 DIAGNOSIS — M5136 Other intervertebral disc degeneration, lumbar region: Secondary | ICD-10-CM | POA: Diagnosis not present

## 2019-06-08 DIAGNOSIS — M48062 Spinal stenosis, lumbar region with neurogenic claudication: Secondary | ICD-10-CM | POA: Diagnosis not present

## 2019-06-21 ENCOUNTER — Encounter: Payer: Self-pay | Admitting: Legal Medicine

## 2019-06-21 ENCOUNTER — Ambulatory Visit (INDEPENDENT_AMBULATORY_CARE_PROVIDER_SITE_OTHER): Payer: Medicare Other | Admitting: Legal Medicine

## 2019-06-21 ENCOUNTER — Other Ambulatory Visit: Payer: Self-pay

## 2019-06-21 VITALS — BP 124/80 | HR 82 | Temp 96.5°F | Resp 16 | Ht 72.0 in | Wt 265.0 lb

## 2019-06-21 DIAGNOSIS — K529 Noninfective gastroenteritis and colitis, unspecified: Secondary | ICD-10-CM

## 2019-06-21 DIAGNOSIS — R3915 Urgency of urination: Secondary | ICD-10-CM | POA: Diagnosis not present

## 2019-06-21 DIAGNOSIS — N138 Other obstructive and reflux uropathy: Secondary | ICD-10-CM | POA: Insufficient documentation

## 2019-06-21 DIAGNOSIS — N401 Enlarged prostate with lower urinary tract symptoms: Secondary | ICD-10-CM

## 2019-06-21 DIAGNOSIS — R1084 Generalized abdominal pain: Secondary | ICD-10-CM

## 2019-06-21 HISTORY — DX: Other obstructive and reflux uropathy: N13.8

## 2019-06-21 HISTORY — DX: Noninfective gastroenteritis and colitis, unspecified: K52.9

## 2019-06-21 HISTORY — DX: Other obstructive and reflux uropathy: N40.1

## 2019-06-21 LAB — POCT URINALYSIS DIP (CLINITEK)
Blood, UA: NEGATIVE
Glucose, UA: NEGATIVE mg/dL
Ketones, POC UA: NEGATIVE mg/dL
Leukocytes, UA: NEGATIVE
Nitrite, UA: NEGATIVE
POC PROTEIN,UA: NEGATIVE
Spec Grav, UA: 1.02 (ref 1.010–1.025)
Urobilinogen, UA: NEGATIVE E.U./dL — AB
pH, UA: 7 (ref 5.0–8.0)

## 2019-06-21 MED ORDER — TAMSULOSIN HCL 0.4 MG PO CAPS: 0.4000 mg | ORAL_CAPSULE | Freq: Every day | ORAL | 3 refills | Status: DC

## 2019-06-21 NOTE — Assessment & Plan Note (Signed)
Patient is having some obstructive symptoms and urinary retention especially at night. TRY some flomax and watch his BS.

## 2019-06-21 NOTE — Assessment & Plan Note (Signed)
Keep well hydrated.  Continue with omeprazole bid. Return if not improving

## 2019-06-21 NOTE — Progress Notes (Signed)
Acute Office Visit  Subjective:    Patient ID: Todd Rose, male    DOB: 04-Feb-1976, 44 y.o.   MRN: [id number]  Chief Complaint  Patient presents with  . Abdominal Pain  . Urinary Urgency    Abdominal Pain This is a new problem. The current episode started in the past 7 days. The onset quality is sudden. The problem occurs 2 to 4 times per day. The problem has been gradually improving. The pain is located in the periumbilical region. The pain is at a severity of 1/10. The quality of the pain is aching. The abdominal pain does not radiate. Associated symptoms include anorexia.   Patient is in today for for 5 days with nausea and some vomiting . Bilious vomit.  Anorexic.  No diarrhea. He is on omeprazole  No fever or chills.  Patient is having problems urinating and does,t always empty.  UA is normal.  Started last week only.  More urgency and has residual. Hid diabetes is in questionable control.    Past Medical History:  Diagnosis Date  . Back pain   . Diabetes mellitus without complication ([place])   . Hypertension     Past Surgical History:  Procedure Laterality Date  . CHOLECYSTECTOMY    . KNEE SURGERY    . SHOULDER SURGERY     left    No family history on file.  Social History   Socioeconomic History  . Marital status: Married    Spouse name: Not on file  . Number of children: Not on file  . Years of education: Not on file  . Highest education level: Not on file  Occupational History  . Not on file  Tobacco Use  . Smoking status: Current Every Day Smoker    Packs/day: 0.40    Types: Cigarettes  . Smokeless tobacco: Former Systems developer  . Tobacco comment: 1 pk lasts 4-5 days.   Substance and Sexual Activity  . Alcohol use: Not Currently  . Drug use: Not Currently  . Sexual activity: Yes  Other Topics Concern  . Not on file  Social History Narrative  . Not on file   Social Determinants of Health   Financial Resource Strain:   . Difficulty of Paying Living  Expenses: Not on file  Food Insecurity:   . Worried About Charity fundraiser in the Last Year: Not on file  . Ran Out of Food in the Last Year: Not on file  Transportation Needs:   . Lack of Transportation (Medical): Not on file  . Lack of Transportation (Non-Medical): Not on file  Physical Activity:   . Days of Exercise per Week: Not on file  . Minutes of Exercise per Session: Not on file  Stress:   . Feeling of Stress : Not on file  Social Connections:   . Frequency of Communication with Friends and Family: Not on file  . Frequency of Social Gatherings with Friends and Family: Not on file  . Attends Religious Services: Not on file  . Active Member of Clubs or Organizations: Not on file  . Attends Archivist Meetings: Not on file  . Marital Status: Not on file  Intimate Partner Violence:   . Fear of Current or Ex-Partner: Not on file  . Emotionally Abused: Not on file  . Physically Abused: Not on file  . Sexually Abused: Not on file    Outpatient Medications Prior to Visit  Medication Sig Dispense Refill  . HYDROcodone-acetaminophen (NORCO/VICODIN) 5-325  MG tablet Take 1-2 tablets by mouth every 6 (six) hours as needed. 15 tablet 0  . ibuprofen (ADVIL,MOTRIN) 200 MG tablet Take 800 mg by mouth every 6 (six) hours as needed for moderate pain.    Marland Kitchen lisinopril (PRINIVIL,ZESTRIL) 40 MG tablet     . omeprazole (PRILOSEC) 40 MG capsule Take 40 mg by mouth 2 (two) times daily.    . pravastatin (PRAVACHOL) 40 MG tablet Take 40 mg by mouth daily.    . divalproex (DEPAKOTE) 500 MG DR tablet Take 500 mg by mouth 2 (two) times daily.    . metFORMIN (GLUCOPHAGE) 500 MG tablet TAKE 1 TABLET BY MOUTH TWICE DAILY. WITH MORNING AND EVENING MEALS.    Marland Kitchen ondansetron (ZOFRAN ODT) 4 MG disintegrating tablet Take 1 tablet (4 mg total) by mouth every 8 (eight) hours as needed for nausea or vomiting. (Patient not taking: Reported on 07/21/2018) 20 tablet 0  . promethazine (PHENERGAN) 25 MG  tablet Take 1 tablet (25 mg total) by mouth every 6 (six) hours as needed for nausea or vomiting. (Patient not taking: Reported on 06/21/2019) 30 tablet 0   No facility-administered medications prior to visit.    Allergies  Allergen Reactions  . Naproxen   . Tramadol Other (See Comments)    Nausea and headache    Review of Systems  Constitutional: Negative.   HENT: Negative.   Respiratory: Negative.   Cardiovascular: Negative.   Gastrointestinal: Positive for abdominal pain and anorexia.  Genitourinary: Negative.   Neurological: Negative.        Objective:    Physical Exam Constitutional:      Appearance: He is well-developed. He is obese.  HENT:     Head: Normocephalic and atraumatic.  Cardiovascular:     Rate and Rhythm: Normal rate and regular rhythm.  Pulmonary:     Effort: Pulmonary effort is normal.     Breath sounds: Normal breath sounds.  Abdominal:     General: Abdomen is flat.     Palpations: Abdomen is soft.     Tenderness: There is no abdominal tenderness.  Musculoskeletal:        General: Normal range of motion.  Skin:    General: Skin is warm and dry.  Neurological:     General: No focal deficit present.     Mental Status: He is alert and oriented to person, place, and time.     BP 124/80 (BP Location: Right Arm, Patient Position: Sitting)   Pulse 82   Temp (!) 96.5 F (35.8 C) (Temporal)   Resp 16   Ht 6' (1.829 m)   Wt 265 lb (120.2 kg)   SpO2 98%   BMI 35.94 kg/m  Wt Readings from Last 3 Encounters:  06/21/19 265 lb (120.2 kg)  10/03/18 240 lb (108.9 kg)  07/21/18 267 lb 10.2 oz (121.4 kg)    Health Maintenance Due  Topic Date Due  . HEMOGLOBIN A1C  03/08/77  . PNEUMOCOCCAL POLYSACCHARIDE VACCINE AGE 14-64 HIGH RISK  02/06/1979  . FOOT EXAM  02/06/1987  . OPHTHALMOLOGY EXAM  02/06/1987  . INFLUENZA VACCINE  12/12/2018    There are no preventive care reminders to display for this patient.   No results found for: TSH Lab  Results  Component Value Date   WBC 9.4 09/05/2018   HGB 14.1 09/05/2018   HCT 42.3 09/05/2018   MCV 88.9 09/05/2018   PLT 269 09/05/2018   Lab Results  Component Value Date   NA 139  09/05/2018   K 3.8 09/05/2018   CO2 26 09/05/2018   GLUCOSE 113 (H) 09/05/2018   BUN 12 09/05/2018   CREATININE 1.05 09/05/2018   BILITOT 0.6 09/05/2018   ALKPHOS 73 09/05/2018   AST 20 09/05/2018   ALT 28 09/05/2018   PROT 6.9 09/05/2018   ALBUMIN 3.6 09/05/2018   CALCIUM 8.9 09/05/2018   ANIONGAP 13 09/05/2018   No results found for: CHOL No results found for: HDL No results found for: LDLCALC No results found for: TRIG No results found for: CHOLHDL No results found for: HGBA1C     Assessment & Plan:   Problem List Items Addressed This Visit      Digestive   Gastroenteritis    Keep well hydrated.  Continue with omeprazole bid. Return if not improving        Genitourinary   BPH with obstruction/lower urinary tract symptoms (Chronic)    Patient is having some obstructive symptoms and urinary retention especially at night. TRY some flomax and watch his BS.      Relevant Medications   tamsulosin (FLOMAX) 0.4 MG CAPS capsule    Other Visit Diagnoses    Urgency of urination    -  Primary   Relevant Medications   tamsulosin (FLOMAX) 0.4 MG CAPS capsule   Generalized abdominal pain       Relevant Orders   POCT URINALYSIS DIP (CLINITEK) (Completed)       Meds ordered this encounter  Medications  . tamsulosin (FLOMAX) 0.4 MG CAPS capsule    Sig: Take 1 capsule (0.4 mg total) by mouth daily.    Dispense:  30 capsule    Refill:  3     Reinaldo Meeker, MD

## 2019-07-22 DIAGNOSIS — M5136 Other intervertebral disc degeneration, lumbar region: Secondary | ICD-10-CM | POA: Diagnosis not present

## 2019-07-22 DIAGNOSIS — Z760 Encounter for issue of repeat prescription: Secondary | ICD-10-CM | POA: Diagnosis not present

## 2019-07-22 DIAGNOSIS — Z79891 Long term (current) use of opiate analgesic: Secondary | ICD-10-CM | POA: Diagnosis not present

## 2019-07-22 DIAGNOSIS — G8929 Other chronic pain: Secondary | ICD-10-CM | POA: Diagnosis not present

## 2019-07-22 DIAGNOSIS — M5116 Intervertebral disc disorders with radiculopathy, lumbar region: Secondary | ICD-10-CM | POA: Diagnosis not present

## 2019-08-05 ENCOUNTER — Other Ambulatory Visit: Payer: Self-pay

## 2019-08-05 ENCOUNTER — Ambulatory Visit (INDEPENDENT_AMBULATORY_CARE_PROVIDER_SITE_OTHER): Payer: Medicare Other | Admitting: Legal Medicine

## 2019-08-05 ENCOUNTER — Encounter: Payer: Self-pay | Admitting: Legal Medicine

## 2019-08-05 VITALS — BP 124/98 | HR 86 | Temp 97.3°F | Resp 17 | Ht 66.0 in | Wt 260.0 lb

## 2019-08-05 DIAGNOSIS — E782 Mixed hyperlipidemia: Secondary | ICD-10-CM

## 2019-08-05 DIAGNOSIS — F319 Bipolar disorder, unspecified: Secondary | ICD-10-CM

## 2019-08-05 DIAGNOSIS — I10 Essential (primary) hypertension: Secondary | ICD-10-CM

## 2019-08-05 DIAGNOSIS — G603 Idiopathic progressive neuropathy: Secondary | ICD-10-CM

## 2019-08-05 DIAGNOSIS — Z79891 Long term (current) use of opiate analgesic: Secondary | ICD-10-CM

## 2019-08-05 DIAGNOSIS — E1151 Type 2 diabetes mellitus with diabetic peripheral angiopathy without gangrene: Secondary | ICD-10-CM | POA: Insufficient documentation

## 2019-08-05 DIAGNOSIS — F310 Bipolar disorder, current episode hypomanic: Secondary | ICD-10-CM

## 2019-08-05 DIAGNOSIS — K7581 Nonalcoholic steatohepatitis (NASH): Secondary | ICD-10-CM

## 2019-08-05 HISTORY — DX: Type 2 diabetes mellitus with diabetic peripheral angiopathy without gangrene: E11.51

## 2019-08-05 HISTORY — DX: Essential (primary) hypertension: I10

## 2019-08-05 HISTORY — DX: Bipolar disorder, unspecified: F31.9

## 2019-08-05 HISTORY — DX: Mixed hyperlipidemia: E78.2

## 2019-08-05 MED ORDER — METFORMIN HCL 500 MG PO TABS: 500.0000 mg | ORAL_TABLET | Freq: Two times a day (BID) | ORAL | 2 refills | Status: DC

## 2019-08-05 NOTE — Assessment & Plan Note (Signed)
Patient has nash is trying to control lipids and lose weight.

## 2019-08-05 NOTE — Progress Notes (Signed)
Established Patient Office Visit  Subjective:  Patient ID: Todd Rose, male    DOB: 05/24/1975  Age: 44 y.o. MRN: [id number]  CC:  Chief Complaint  Patient presents with   Hypertension   Hyperlipidemia   Dizziness    yesterday   Diabetes   Manic Behavior    HPI Todd Rose presents for Chronic visit.  Patient presents for follow up of hypertension.  Patient tolerating lisinopril well with side effects.  Patient was diagnosed with hypertension 2015 so has been treated for hypertension for 5 years.Patient is working on maintaining diet and exercise regimen and follows up as directed. Complication include none.  Patient presents with hyperlipidemia.  Compliance with treatment has been good; patient takes medicines as directed, maintains low cholesterol diet, follows up as directed, and maintains exercise regimen.  Patient is using pravasatin without problems.  Patient present with type 2 diabetes.  Specifically, this is type 2, noninsulin requiring diabetes, complicated by vascular disease.  Compliance with treatment has been good; patient take medicines as directed, maintains diet and exercise regimen, follows up as directed, and is keeping glucose diary.  Date of  diagnosis 2015.  Depression screen has been performed.Tobacco screen smoker 1/2 ppd. Current medicines for diabetes he stopped his medicines.  Patient is on lisinopril for renal protection and pravastatin for cholesterol control.  Patient performs foot exams daily and last ophthalmologic exam was this year.  This patient has bipolar depression for 6 months.  PHQ9 =23.  Patient is having less anhedonia.  The patient has less future plans and prospects.  The depression is worse with stress.  The patient is not exercising and working on behavior to improve mental health.  Patient is not seeing a therapist or psychiatrist.  na  Patient is on no medcines.  Past Medical History:  Diagnosis Date   Back pain    Diabetes  mellitus without complication ([place])    Hypertension    Nonalcoholic steatohepatitis ([facility])     Past Surgical History:  Procedure Laterality Date   CHOLECYSTECTOMY     KNEE SURGERY     SHOULDER SURGERY     left    History reviewed. No pertinent family history.  Social History   Socioeconomic History   Marital status: Married    Spouse name: Not on file   Number of children: Not on file   Years of education: Not on file   Highest education level: Not on file  Occupational History   Not on file  Tobacco Use   Smoking status: Current Every Day Smoker    Packs/day: 0.50    Types: Cigarettes   Smokeless tobacco: Former Systems developer   Tobacco comment: 1 pk lasts 4-5 days.   Substance and Sexual Activity   Alcohol use: Not Currently   Drug use: Not Currently   Sexual activity: Yes    Partners: Female  Other Topics Concern   Not on file  Social History Narrative   Not on file   Social Determinants of Health   Financial Resource Strain:    Difficulty of Paying Living Expenses:   Food Insecurity:    Worried About Charity fundraiser in the Last Year:    Arboriculturist in the Last Year:   Transportation Needs:    Film/video editor (Medical):    Lack of Transportation (Non-Medical):   Physical Activity:    Days of Exercise per Week:    Minutes of Exercise per Session:   Stress:  Feeling of Stress :   Social Connections:    Frequency of Communication with Friends and Family:    Frequency of Social Gatherings with Friends and Family:    Attends Religious Services:    Active Member of Clubs or Organizations:    Attends Music therapist:    Marital Status:   Intimate Partner Violence:    Fear of Current or Ex-Partner:    Emotionally Abused:    Physically Abused:    Sexually Abused:     Outpatient Medications Prior to Visit  Medication Sig Dispense Refill   cariprazine (VRAYLAR) capsule Take by mouth.      gabapentin (NEURONTIN) 800 MG tablet Take 800 mg by mouth 4 (four) times daily.     HYDROcodone-acetaminophen (NORCO/VICODIN) 5-325 MG tablet Take 1-2 tablets by mouth every 6 (six) hours as needed. 15 tablet 0   lisinopril (PRINIVIL,ZESTRIL) 40 MG tablet      omeprazole (PRILOSEC) 40 MG capsule Take 40 mg by mouth 2 (two) times daily.     pravastatin (PRAVACHOL) 40 MG tablet Take 40 mg by mouth daily.     ibuprofen (ADVIL,MOTRIN) 200 MG tablet Take 800 mg by mouth every 6 (six) hours as needed for moderate pain.     divalproex (DEPAKOTE) 500 MG DR tablet Take 500 mg by mouth 2 (two) times daily.     metFORMIN (GLUCOPHAGE) 500 MG tablet TAKE 1 TABLET BY MOUTH TWICE DAILY. WITH MORNING AND EVENING MEALS.     ondansetron (ZOFRAN ODT) 4 MG disintegrating tablet Take 1 tablet (4 mg total) by mouth every 8 (eight) hours as needed for nausea or vomiting. (Patient not taking: Reported on 07/21/2018) 20 tablet 0   promethazine (PHENERGAN) 25 MG tablet Take 1 tablet (25 mg total) by mouth every 6 (six) hours as needed for nausea or vomiting. (Patient not taking: Reported on 06/21/2019) 30 tablet 0   tamsulosin (FLOMAX) 0.4 MG CAPS capsule Take 1 capsule (0.4 mg total) by mouth daily. 30 capsule 3   No facility-administered medications prior to visit.    Allergies  Allergen Reactions   Naproxen    Tramadol Other (See Comments)    Nausea and headache    ROS Review of Systems  Constitutional: Negative.   HENT: Negative.   Eyes: Negative.   Respiratory: Negative.   Cardiovascular: Negative.   Gastrointestinal: Negative.   Endocrine: Negative.   Genitourinary: Negative.   Musculoskeletal: Negative.   Skin: Negative.   Neurological: Negative.   Psychiatric/Behavioral: Positive for dysphoric mood.      Objective:    Physical Exam  Constitutional: He appears well-developed and well-nourished.  obese  HENT:  Head: Normocephalic and atraumatic.  Eyes: Pupils are equal, round, and  reactive to light. Conjunctivae and EOM are normal.  Cardiovascular: Normal rate, regular rhythm and normal heart sounds.  Pulmonary/Chest: Effort normal and breath sounds normal.  Abdominal: Soft. Bowel sounds are normal.  Musculoskeletal:        General: Normal range of motion.     Cervical back: Normal range of motion and neck supple.  Neurological: He is alert. He has normal reflexes.  Skin: Skin is warm.  Psychiatric: He has a normal mood and affect.  Vitals reviewed.   BP (!) 124/98 (BP Location: Left Arm, Patient Position: Sitting)    Pulse 86    Temp (!) 97.3 F (36.3 C) (Temporal)    Resp 17    Ht 5' 6"  (1.676 m)    Wt 260 lb (  117.9 kg)    BMI 41.97 kg/m  Wt Readings from Last 3 Encounters:  08/05/19 260 lb (117.9 kg)  06/21/19 265 lb (120.2 kg)  10/03/18 240 lb (108.9 kg)     Health Maintenance Due  Topic Date Due   HEMOGLOBIN A1C  Never done   PNEUMOCOCCAL POLYSACCHARIDE VACCINE AGE 84-64 HIGH RISK  Never done   FOOT EXAM  Never done   OPHTHALMOLOGY EXAM  Never done   INFLUENZA VACCINE  Never done    There are no preventive care reminders to display for this patient.  No results found for: TSH Lab Results  Component Value Date   WBC 9.4 09/05/2018   HGB 14.1 09/05/2018   HCT 42.3 09/05/2018   MCV 88.9 09/05/2018   PLT 269 09/05/2018   Lab Results  Component Value Date   NA 139 09/05/2018   K 3.8 09/05/2018   CO2 26 09/05/2018   GLUCOSE 113 (H) 09/05/2018   BUN 12 09/05/2018   CREATININE 1.05 09/05/2018   BILITOT 0.6 09/05/2018   ALKPHOS 73 09/05/2018   AST 20 09/05/2018   ALT 28 09/05/2018   PROT 6.9 09/05/2018   ALBUMIN 3.6 09/05/2018   CALCIUM 8.9 09/05/2018   ANIONGAP 13 09/05/2018   No results found for: CHOL No results found for: HDL No results found for: LDLCALC No results found for: TRIG No results found for: CHOLHDL No results found for: HGBA1C    Assessment & Plan:   Problem List Items Addressed This Visit       Cardiovascular and Mediastinum   Type II diabetes mellitus with peripheral circulatory disorder [facility])    An individual care plan was established and reinforced today.  The patient's status was assessed using clinical findings on exam, labs and diagnostic testing. Patient success at meeting goals based on disease specific evidence-based guidelines and found to be good controlled. Medications were assessed and patient's understanding of the medical issues , including barriers were assessed. Recommend adherence to a diabetic diet, a graduated exercise program, HgbA1c level is checked quarterly, and urine microalbumin performed yearly .  Annual mono-filament sensation testing performed. Lower blood pressure and control hyperlipidemia is important. Get annual eye exams and annual flu shots and smoking cessation discussed.  Self management goals were discussed.      Relevant Medications   metFORMIN (GLUCOPHAGE) 500 MG tablet   Benign hypertension    An individual care plan was established and reinforced today.  The patient's status was assessed using clinical findings on exam and labs or diagnostic tests. The patient's success at meeting treatment goals on disease specific evidence-based guidelines and found to be well controlled. SELF MANAGEMENT: The patient and I together assessed ways to personally work towards obtaining the recommended goals. RECOMMENDATIONS: avoid decongestants found in common cold remedies, decrease consumption of alcohol, perform routine monitoring of BP with home BP cuff, exercise, reduction of dietary salt, take medicines as prescribed, try not to miss doses and quit smoking.  Regular exercise and maintaining a healthy weight is needed.  Stress reduction may help. A CLINICAL SUMMARY including written plan identify barriers to care unique to individual due to social or financial issues.  We attempt to mutually creat solutions for individual and family understanding.         Digestive   Nonalcoholic steatohepatitis ([facility])    Patient has nash is trying to control lipids and lose weight.        Other   Bipolar 1 disorder ([place])  Patienty is doing poorly and we discussed trying vrylar 1.5 mg a day, follow up 2 weeks.      Mixed hyperlipidemia - Primary    AN INDIVIDUAL CARE PLAN was established and reinforced today.  The patient's status was assessed using clinical findings on exam, lab and other diagnostic tests. The patient's disease status was assessed based on evidence-based guidelines and found to be well controlled. MEDICATIONS were reviewed. SELF MANAGEMENT GOALS have been discussed and patient's success at attaining the goal of low cholesterol was assessed. RECOMMENDATION given include regular exercise 3 days a week and low cholesterol/low fat diet. CLINICAL SUMMARY including written plan to identify barriers unique to the patient due to social or economic  reasons was discussed.      Relevant Orders   LDL Cholesterol, Direct    Other Visit Diagnoses    Benign essential hypertension       Relevant Orders   CBC with Differential   Comprehensive metabolic panel   Bipolar I disorder, most recent episode hypomanic (HCC)       Idiopathic progressive polyneuropathy       Relevant Medications   gabapentin (NEURONTIN) 800 MG tablet   cariprazine (VRAYLAR) capsule   Type 2 diabetes mellitus with diabetic peripheral angiopathy without gangrene, without long-term current use of insulin (HCC)       Relevant Medications   metFORMIN (GLUCOPHAGE) 500 MG tablet   Other Relevant Orders   Hemoglobin A1c   Long term (current) use of opiate analgesic          Meds ordered this encounter  Medications   metFORMIN (GLUCOPHAGE) 500 MG tablet    Sig: Take 1 tablet (500 mg total) by mouth 2 (two) times daily with a meal.    Dispense:  180 tablet    Refill:  2    Follow-up: Return in about 2 weeks (around 08/19/2019).    Reinaldo Meeker, MD

## 2019-08-05 NOTE — Assessment & Plan Note (Signed)
Patienty is doing poorly and we discussed trying vrylar 1.5 mg a day, follow up 2 weeks.

## 2019-08-05 NOTE — Assessment & Plan Note (Signed)

## 2019-08-05 NOTE — Assessment & Plan Note (Signed)
An individual care plan was established and reinforced today.  The patient's status was assessed using clinical findings on exam, labs and diagnostic testing. Patient success at meeting goals based on disease specific evidence-based guidelines and found to be good controlled. Medications were assessed and patient's understanding of the medical issues , including barriers were assessed. Recommend adherence to a diabetic diet, a graduated exercise program, HgbA1c level is checked quarterly, and urine microalbumin performed yearly .  Annual mono-filament sensation testing performed. Lower blood pressure and control hyperlipidemia is important. Get annual eye exams and annual flu shots and smoking cessation discussed.  Self management goals were discussed.

## 2019-08-05 NOTE — Assessment & Plan Note (Signed)

## 2019-08-06 LAB — COMPREHENSIVE METABOLIC PANEL
ALT: 39 IU/L (ref 0–44)
AST: 27 IU/L (ref 0–40)
Albumin/Globulin Ratio: 1.6 (ref 1.2–2.2)
Albumin: 4.4 g/dL (ref 4.0–5.0)
Alkaline Phosphatase: 117 IU/L (ref 39–117)
BUN/Creatinine Ratio: 14 (ref 9–20)
BUN: 13 mg/dL (ref 6–24)
Bilirubin Total: 0.9 mg/dL (ref 0.0–1.2)
CO2: 24 mmol/L (ref 20–29)
Calcium: 9.8 mg/dL (ref 8.7–10.2)
Chloride: 102 mmol/L (ref 96–106)
Creatinine, Ser: 0.92 mg/dL (ref 0.76–1.27)
GFR calc Af Amer: 118 mL/min/{1.73_m2} (ref >59)
GFR calc non Af Amer: 102 mL/min/{1.73_m2} (ref >59)
Globulin, Total: 2.7 g/dL (ref 1.5–4.5)
Glucose: 137 mg/dL — ABNORMAL HIGH (ref 65–99)
Potassium: 4.4 mmol/L (ref 3.5–5.2)
Sodium: 139 mmol/L (ref 134–144)
Total Protein: 7.1 g/dL (ref 6.0–8.5)

## 2019-08-06 LAB — CBC WITH DIFFERENTIAL/PLATELET
Basophils Absolute: 0.1 10*3/uL (ref 0.0–0.2)
Basos: 1 %
EOS (ABSOLUTE): 0.1 10*3/uL (ref 0.0–0.4)
Eos: 1 %
Hematocrit: 42.6 % (ref 37.5–51.0)
Hemoglobin: 14.2 g/dL (ref 13.0–17.7)
Immature Grans (Abs): 0 10*3/uL (ref 0.0–0.1)
Immature Granulocytes: 0 %
Lymphocytes Absolute: 2.8 10*3/uL (ref 0.7–3.1)
Lymphs: 22 %
MCH: 29.2 pg (ref 26.6–33.0)
MCHC: 33.3 g/dL (ref 31.5–35.7)
MCV: 88 fL (ref 79–97)
Monocytes Absolute: 0.7 10*3/uL (ref 0.1–0.9)
Monocytes: 5 %
Neutrophils Absolute: 8.8 10*3/uL — ABNORMAL HIGH (ref 1.4–7.0)
Neutrophils: 71 %
Platelets: 282 10*3/uL (ref 150–450)
RBC: 4.87 x10E6/uL (ref 4.14–5.80)
RDW: 13.9 % (ref 11.6–15.4)
WBC: 12.5 10*3/uL — ABNORMAL HIGH (ref 3.4–10.8)

## 2019-08-06 LAB — LDL CHOLESTEROL, DIRECT: LDL Direct: 102 mg/dL — ABNORMAL HIGH (ref 0–99)

## 2019-08-06 LAB — HEMOGLOBIN A1C
Est. average glucose Bld gHb Est-mCnc: 157 mg/dL
Hgb A1c MFr Bld: 7.1 % — ABNORMAL HIGH (ref 4.8–5.6)

## 2019-08-06 NOTE — Progress Notes (Signed)
WBC still up chronic, glucose 137, kidney tests normal, LDL 102 up, A1c 7.1 OK

## 2019-08-19 ENCOUNTER — Ambulatory Visit: Payer: Medicare Other | Admitting: Legal Medicine

## 2019-08-19 DIAGNOSIS — Z760 Encounter for issue of repeat prescription: Secondary | ICD-10-CM | POA: Diagnosis not present

## 2019-08-19 DIAGNOSIS — M5116 Intervertebral disc disorders with radiculopathy, lumbar region: Secondary | ICD-10-CM | POA: Diagnosis not present

## 2019-08-19 DIAGNOSIS — G8929 Other chronic pain: Secondary | ICD-10-CM | POA: Diagnosis not present

## 2019-08-19 DIAGNOSIS — Z79891 Long term (current) use of opiate analgesic: Secondary | ICD-10-CM | POA: Diagnosis not present

## 2019-08-19 DIAGNOSIS — M5136 Other intervertebral disc degeneration, lumbar region: Secondary | ICD-10-CM | POA: Diagnosis not present

## 2019-10-01 DIAGNOSIS — M9973 Connective tissue and disc stenosis of intervertebral foramina of lumbar region: Secondary | ICD-10-CM | POA: Diagnosis not present

## 2019-10-01 DIAGNOSIS — M545 Low back pain: Secondary | ICD-10-CM | POA: Diagnosis not present

## 2019-10-01 DIAGNOSIS — M5416 Radiculopathy, lumbar region: Secondary | ICD-10-CM | POA: Diagnosis not present

## 2019-10-01 DIAGNOSIS — M48061 Spinal stenosis, lumbar region without neurogenic claudication: Secondary | ICD-10-CM

## 2019-10-01 DIAGNOSIS — M5136 Other intervertebral disc degeneration, lumbar region: Secondary | ICD-10-CM | POA: Diagnosis not present

## 2019-10-01 DIAGNOSIS — M48062 Spinal stenosis, lumbar region with neurogenic claudication: Secondary | ICD-10-CM | POA: Diagnosis not present

## 2019-10-01 HISTORY — DX: Spinal stenosis, lumbar region without neurogenic claudication: M48.061

## 2019-10-21 DIAGNOSIS — M5136 Other intervertebral disc degeneration, lumbar region: Secondary | ICD-10-CM | POA: Diagnosis not present

## 2019-10-21 DIAGNOSIS — Z79891 Long term (current) use of opiate analgesic: Secondary | ICD-10-CM | POA: Diagnosis not present

## 2019-10-21 DIAGNOSIS — Z760 Encounter for issue of repeat prescription: Secondary | ICD-10-CM | POA: Diagnosis not present

## 2019-10-21 DIAGNOSIS — G8929 Other chronic pain: Secondary | ICD-10-CM | POA: Diagnosis not present

## 2019-10-21 DIAGNOSIS — M5116 Intervertebral disc disorders with radiculopathy, lumbar region: Secondary | ICD-10-CM | POA: Diagnosis not present

## 2019-10-25 ENCOUNTER — Other Ambulatory Visit: Payer: Self-pay | Admitting: Legal Medicine

## 2019-10-25 DIAGNOSIS — M5441 Lumbago with sciatica, right side: Secondary | ICD-10-CM | POA: Diagnosis not present

## 2019-10-25 DIAGNOSIS — M9973 Connective tissue and disc stenosis of intervertebral foramina of lumbar region: Secondary | ICD-10-CM | POA: Diagnosis not present

## 2019-10-25 DIAGNOSIS — M5136 Other intervertebral disc degeneration, lumbar region: Secondary | ICD-10-CM | POA: Diagnosis not present

## 2019-10-25 DIAGNOSIS — F17210 Nicotine dependence, cigarettes, uncomplicated: Secondary | ICD-10-CM

## 2019-10-25 DIAGNOSIS — G8929 Other chronic pain: Secondary | ICD-10-CM

## 2019-10-25 DIAGNOSIS — M5416 Radiculopathy, lumbar region: Secondary | ICD-10-CM | POA: Diagnosis not present

## 2019-10-25 DIAGNOSIS — M545 Low back pain: Secondary | ICD-10-CM | POA: Diagnosis not present

## 2019-10-25 HISTORY — DX: Lumbago with sciatica, right side: M54.41

## 2019-10-25 HISTORY — DX: Other chronic pain: G89.29

## 2019-10-25 HISTORY — DX: Nicotine dependence, cigarettes, uncomplicated: F17.210

## 2019-11-04 DIAGNOSIS — M9973 Connective tissue and disc stenosis of intervertebral foramina of lumbar region: Secondary | ICD-10-CM | POA: Diagnosis not present

## 2019-11-04 DIAGNOSIS — M48061 Spinal stenosis, lumbar region without neurogenic claudication: Secondary | ICD-10-CM | POA: Diagnosis not present

## 2019-11-04 DIAGNOSIS — M5116 Intervertebral disc disorders with radiculopathy, lumbar region: Secondary | ICD-10-CM | POA: Diagnosis not present

## 2019-11-04 DIAGNOSIS — M5136 Other intervertebral disc degeneration, lumbar region: Secondary | ICD-10-CM | POA: Diagnosis not present

## 2019-11-04 DIAGNOSIS — M5416 Radiculopathy, lumbar region: Secondary | ICD-10-CM | POA: Diagnosis not present

## 2019-11-16 DIAGNOSIS — M5136 Other intervertebral disc degeneration, lumbar region: Secondary | ICD-10-CM | POA: Diagnosis not present

## 2019-11-16 DIAGNOSIS — M9973 Connective tissue and disc stenosis of intervertebral foramina of lumbar region: Secondary | ICD-10-CM | POA: Diagnosis not present

## 2019-11-16 DIAGNOSIS — G8929 Other chronic pain: Secondary | ICD-10-CM | POA: Diagnosis not present

## 2019-11-16 DIAGNOSIS — M5127 Other intervertebral disc displacement, lumbosacral region: Secondary | ICD-10-CM | POA: Diagnosis not present

## 2019-11-16 DIAGNOSIS — M5126 Other intervertebral disc displacement, lumbar region: Secondary | ICD-10-CM | POA: Diagnosis not present

## 2019-11-16 DIAGNOSIS — M5441 Lumbago with sciatica, right side: Secondary | ICD-10-CM | POA: Diagnosis not present

## 2019-11-16 DIAGNOSIS — M48061 Spinal stenosis, lumbar region without neurogenic claudication: Secondary | ICD-10-CM | POA: Diagnosis not present

## 2019-11-16 DIAGNOSIS — M5416 Radiculopathy, lumbar region: Secondary | ICD-10-CM | POA: Diagnosis not present

## 2019-11-27 DIAGNOSIS — H40033 Anatomical narrow angle, bilateral: Secondary | ICD-10-CM | POA: Diagnosis not present

## 2019-11-27 DIAGNOSIS — E119 Type 2 diabetes mellitus without complications: Secondary | ICD-10-CM | POA: Diagnosis not present

## 2019-12-08 DIAGNOSIS — M5416 Radiculopathy, lumbar region: Secondary | ICD-10-CM | POA: Diagnosis not present

## 2019-12-21 DIAGNOSIS — Z01810 Encounter for preprocedural cardiovascular examination: Secondary | ICD-10-CM | POA: Diagnosis not present

## 2019-12-22 DIAGNOSIS — Z131 Encounter for screening for diabetes mellitus: Secondary | ICD-10-CM | POA: Diagnosis not present

## 2019-12-22 DIAGNOSIS — Z01810 Encounter for preprocedural cardiovascular examination: Secondary | ICD-10-CM | POA: Diagnosis not present

## 2019-12-22 DIAGNOSIS — M5416 Radiculopathy, lumbar region: Secondary | ICD-10-CM | POA: Diagnosis not present

## 2019-12-22 DIAGNOSIS — F17210 Nicotine dependence, cigarettes, uncomplicated: Secondary | ICD-10-CM | POA: Diagnosis not present

## 2019-12-22 DIAGNOSIS — Z01818 Encounter for other preprocedural examination: Secondary | ICD-10-CM | POA: Diagnosis not present

## 2019-12-28 DIAGNOSIS — I10 Essential (primary) hypertension: Secondary | ICD-10-CM | POA: Diagnosis not present

## 2019-12-28 DIAGNOSIS — M5416 Radiculopathy, lumbar region: Secondary | ICD-10-CM | POA: Diagnosis not present

## 2019-12-28 DIAGNOSIS — E119 Type 2 diabetes mellitus without complications: Secondary | ICD-10-CM | POA: Diagnosis not present

## 2019-12-28 DIAGNOSIS — E7800 Pure hypercholesterolemia, unspecified: Secondary | ICD-10-CM | POA: Diagnosis not present

## 2019-12-28 DIAGNOSIS — M4807 Spinal stenosis, lumbosacral region: Secondary | ICD-10-CM | POA: Diagnosis not present

## 2019-12-28 DIAGNOSIS — Z7984 Long term (current) use of oral hypoglycemic drugs: Secondary | ICD-10-CM | POA: Diagnosis not present

## 2019-12-28 DIAGNOSIS — M48061 Spinal stenosis, lumbar region without neurogenic claudication: Secondary | ICD-10-CM | POA: Diagnosis not present

## 2019-12-28 DIAGNOSIS — Z79899 Other long term (current) drug therapy: Secondary | ICD-10-CM | POA: Diagnosis not present

## 2019-12-28 HISTORY — PX: BACK SURGERY: SHX140

## 2020-01-18 DIAGNOSIS — Z760 Encounter for issue of repeat prescription: Secondary | ICD-10-CM | POA: Diagnosis not present

## 2020-01-18 DIAGNOSIS — M5116 Intervertebral disc disorders with radiculopathy, lumbar region: Secondary | ICD-10-CM | POA: Diagnosis not present

## 2020-01-18 DIAGNOSIS — G8929 Other chronic pain: Secondary | ICD-10-CM | POA: Diagnosis not present

## 2020-01-18 DIAGNOSIS — Z79891 Long term (current) use of opiate analgesic: Secondary | ICD-10-CM | POA: Diagnosis not present

## 2020-01-18 DIAGNOSIS — Z79899 Other long term (current) drug therapy: Secondary | ICD-10-CM | POA: Diagnosis not present

## 2020-01-19 DIAGNOSIS — Z4789 Encounter for other orthopedic aftercare: Secondary | ICD-10-CM | POA: Diagnosis not present

## 2020-01-21 ENCOUNTER — Other Ambulatory Visit: Payer: Self-pay

## 2020-01-21 ENCOUNTER — Ambulatory Visit (INDEPENDENT_AMBULATORY_CARE_PROVIDER_SITE_OTHER): Payer: Medicare Other | Admitting: Legal Medicine

## 2020-01-21 ENCOUNTER — Encounter: Payer: Self-pay | Admitting: Legal Medicine

## 2020-01-21 VITALS — BP 146/110 | HR 97 | Temp 97.4°F | Resp 16 | Ht 67.0 in | Wt 258.0 lb

## 2020-01-21 DIAGNOSIS — F319 Bipolar disorder, unspecified: Secondary | ICD-10-CM | POA: Diagnosis not present

## 2020-01-21 DIAGNOSIS — R079 Chest pain, unspecified: Secondary | ICD-10-CM | POA: Diagnosis not present

## 2020-01-21 DIAGNOSIS — F5102 Adjustment insomnia: Secondary | ICD-10-CM | POA: Diagnosis not present

## 2020-01-21 DIAGNOSIS — E782 Mixed hyperlipidemia: Secondary | ICD-10-CM

## 2020-01-21 MED ORDER — PRAVASTATIN SODIUM 40 MG PO TABS: 40.0000 mg | ORAL_TABLET | Freq: Every day | ORAL | 2 refills | Status: DC

## 2020-01-21 MED ORDER — QUETIAPINE FUMARATE 50 MG PO TABS: 50.0000 mg | ORAL_TABLET | Freq: Every day | ORAL | 2 refills | Status: DC

## 2020-01-21 MED ORDER — FLUOXETINE HCL 20 MG PO TABS: 20.0000 mg | ORAL_TABLET | Freq: Every day | ORAL | 3 refills | Status: DC

## 2020-01-21 NOTE — Patient Instructions (Addendum)
Use fluoxetine in am and quetiapine at night Depression Screening Depression screening is a tool that your health care provider can use to learn if you have symptoms of depression. Depression is a common condition with many symptoms that are also often found in other conditions. Depression is treatable, but it must first be diagnosed. You may not know that certain feelings, thoughts, and behaviors that you are having can be symptoms of depression. Taking a depression screening test can help you and your health care provider decide if you need more assessment, or if you should be referred to a mental health care provider. What are the screening tests?  You may have a physical exam to see if another condition is affecting your mental health. You may have a blood or urine sample taken during the physical exam.  You may be interviewed using a screening tool that was developed from research, such as one of these: ? Patient Health Questionnaire (PHQ). This is a set of either 2 or 9 questions. A health care provider who has been trained to score this screening test uses a guide to assess if your symptoms suggest that you may have depression. ? Hamilton Depression Rating Scale (HAM-D). This is a set of either 17 or 24 questions. You may be asked to take it again during or after your treatment, to see if your depression has gotten better. ? Beck Depression Inventory (BDI). This is a set of 21 multiple choice questions. Your health care provider scores your answers to assess:  Your level of depression, ranging from mild to severe.  Your response to treatment.  Your health care provider may talk with you about your daily activities, such as eating, sleeping, work, and recreation, and ask if you have had any changes in activity.  Your health care provider may ask you to see a mental health specialist, such as a psychiatrist or psychologist, for more evaluation. Who should be screened for depression?   All  adults, including adults with a family history of a mental health disorder.  Adolescents who are 43-43 years old.  People who are recovering from a myocardial infarction (MI).  Pregnant women, or women who have given birth.  People who have a long-term (chronic) illness.  Anyone who has been diagnosed with another type of a mental health disorder.  Anyone who has symptoms that could show depression. What do my results mean? Your health care provider will review the results of your depression screening, physical exam, and lab tests. Positive screens suggest that you may have depression. Screening is the first step in getting the care that you may need. It is up to you to get your screening results. Ask your health care provider, or the department that is doing your screening tests, when your results will be ready. Talk with your health care provider about your results and diagnosis. A diagnosis of depression is made using the Diagnostic and Statistical Manual of Mental Disorders (DSM-V). This is a book that lists the number and type of symptoms that must be present for a health care provider to give a specific diagnosis.  Your health care provider may work with you to treat your symptoms of depression, or your health care provider may help you find a mental health provider who can assess, diagnose, and treat your depression. Get help right away if:  You have thoughts about hurting yourself or others. If you ever feel like you may hurt yourself or others, or have thoughts about taking  your own life, get help right away. You can go to your nearest emergency department or call:  Your local emergency services (911 in the [place]  A suicide crisis helpline, such as the [facility] at 317-585-7262. This is open 24 hours a day. Summary  Depression screening is the first step in getting the help that you may need.  If your screening test shows symptoms of depression (is  positive), your health care provider may ask you to see a mental health provider.  Anyone who is age 43 or older should be screened for depression. This information is not intended to replace advice given to you by your health care provider. Make sure you discuss any questions you have with your health care provider. Document Revised: 04/11/2017 Document Reviewed: 09/13/2016 Elsevier Patient Education  [employer].

## 2020-01-21 NOTE — Progress Notes (Addendum)
Subjective:  Patient ID: Todd Rose, male    DOB: 08-Nov-1975  Age: 44 y.o. MRN: [id number]  Chief Complaint  Patient presents with  . Stress  . Anxiety    HPI: visit severe depression after sister in accident who died of MI shortly  after  This patient has major depression for 3 weeks.  PHQ9 =18.  Patient is having less anhedonia.  The patient has less future plans and prospects.  The depression is worse with stress.  The patient is not exercising and working on behavior to improve mental health.  Patient is not seeing a therapist or psychiatrist.   na  Patient is on citalopram start  Patient presents for follow up of hypertension.  Patient tolerating lisinopril well with side effects.  Patient was diagnosed with hypertension 2010 so has been treated for hypertension for 10 years.Patient is working on maintaining diet and exercise regimen and follows up as directed. Complication include none.Marland Kitchen   He is getting chest pain but non exertional, he is a smoker, diabetic, hypertension and hypercholesterolemia with mother dying of cardiac disease at 69 yo. Current Outpatient Medications on File Prior to Visit  Medication Sig Dispense Refill  . gabapentin (NEURONTIN) 800 MG tablet Take 1 tablet by mouth 4 times daily 120 tablet 4  . HYDROcodone-acetaminophen (NORCO) 7.5-325 MG tablet Take 1 tablet by mouth every 6 (six) hours as needed for moderate pain.    Marland Kitchen ibuprofen (ADVIL) 200 MG tablet Take by mouth.    Marland Kitchen lisinopril (PRINIVIL,ZESTRIL) 40 MG tablet     . metFORMIN (GLUCOPHAGE) 500 MG tablet Take 1 tablet (500 mg total) by mouth 2 (two) times daily with a meal. 180 tablet 2  . methocarbamol (ROBAXIN) 750 MG tablet Take 750 mg by mouth every 6 (six) hours as needed.    . naloxone (NARCAN) nasal spray 4 mg/0.1 mL Administer as directed as needed for narcotic overdose.    Marland Kitchen omeprazole (PRILOSEC) 40 MG capsule Take 40 mg by mouth 2 (two) times daily.    Marland Kitchen sulfamethoxazole-trimethoprim (BACTRIM DS)  800-160 MG tablet Take 1 tablet by mouth 2 (two) times daily.     No current facility-administered medications on file prior to visit.   Past Medical History:  Diagnosis Date  . Back pain   . Diabetes mellitus without complication ([place])   . Hypertension   . Nonalcoholic steatohepatitis ([facility])    Past Surgical History:  Procedure Laterality Date  . CHOLECYSTECTOMY    . KNEE SURGERY    . SHOULDER SURGERY     left    History reviewed. No pertinent family history. Social History   Socioeconomic History  . Marital status: Married    Spouse name: Not on file  . Number of children: Not on file  . Years of education: Not on file  . Highest education level: Not on file  Occupational History  . Not on file  Tobacco Use  . Smoking status: Current Every Day Smoker    Packs/day: 0.50    Types: Cigarettes  . Smokeless tobacco: Former Systems developer  . Tobacco comment: 1 pk lasts 4-5 days.   Vaping Use  . Vaping Use: Never used  Substance and Sexual Activity  . Alcohol use: Not Currently  . Drug use: Not Currently  . Sexual activity: Yes    Partners: Female  Other Topics Concern  . Not on file  Social History Narrative  . Not on file   Social Determinants of Health   Financial  Resource Strain:   . Difficulty of Paying Living Expenses: Not on file  Food Insecurity:   . Worried About Charity fundraiser in the Last Year: Not on file  . Ran Out of Food in the Last Year: Not on file  Transportation Needs:   . Lack of Transportation (Medical): Not on file  . Lack of Transportation (Non-Medical): Not on file  Physical Activity:   . Days of Exercise per Week: Not on file  . Minutes of Exercise per Session: Not on file  Stress:   . Feeling of Stress : Not on file  Social Connections:   . Frequency of Communication with Friends and Family: Not on file  . Frequency of Social Gatherings with Friends and Family: Not on file  . Attends Religious Services: Not on file  . Active Member of  Clubs or Organizations: Not on file  . Attends Archivist Meetings: Not on file  . Marital Status: Not on file    Review of Systems  Constitutional: Negative.   HENT: Negative.   Eyes: Negative.   Respiratory: Negative.  Negative for cough, shortness of breath and stridor.   Cardiovascular: Positive for chest pain. Negative for palpitations and leg swelling.  Gastrointestinal: Negative.   Endocrine: Negative.   Musculoskeletal: Negative.   Skin: Negative.   Neurological: Negative.   Psychiatric/Behavioral: Positive for dysphoric mood.   EKG: NSR 68, PR 152 msec, QRS 94 msec, QTc 286 msec, Axis 5 degrees, No ST-T cahnges  Objective:  BP (!) 146/110   Pulse 97   Temp (!) 97.4 F (36.3 C)   Resp 16   Ht 5' 7"  (1.702 m)   Wt 258 lb (117 kg)   SpO2 98%   BMI 40.41 kg/m   BP/Weight 01/21/2020 0/07/4915 01/12/5055  Systolic BP 979 480 165  Diastolic BP 537 98 80  Wt. (Lbs) 258 260 265  BMI 40.41 41.97 35.94    Physical Exam Vitals reviewed.  Constitutional:      Appearance: He is obese.  HENT:     Right Ear: Tympanic membrane normal.     Left Ear: Tympanic membrane normal.  Cardiovascular:     Rate and Rhythm: Normal rate and regular rhythm.     Pulses: Normal pulses.     Heart sounds: Normal heart sounds.  Pulmonary:     Effort: Pulmonary effort is normal.     Breath sounds: Normal breath sounds.  Musculoskeletal:        General: Normal range of motion.     Cervical back: Normal range of motion and neck supple.  Skin:    General: Skin is warm and dry.     Capillary Refill: Capillary refill takes less than 2 seconds.  Neurological:     Mental Status: He is alert.    Depression screen [facility] 2/9 01/24/2020 08/06/2019 08/05/2019  Decreased Interest 3 3 0  Down, Depressed, Hopeless 3 3 1   PHQ - 2 Score 6 6 1   Altered sleeping 3 3 -  Tired, decreased energy 3 3 -  Change in appetite 3 2 -  Feeling bad or failure about yourself  2 3 -  Trouble concentrating  2 3 -  Moving slowly or fidgety/restless 2 2 -  Suicidal thoughts 0 1 -  PHQ-9 Score 21 23 -  Difficult doing work/chores Extremely dIfficult Extremely dIfficult -      Lab Results  Component Value Date   WBC 12.5 (H) 08/05/2019   HGB  14.2 08/05/2019   HCT 42.6 08/05/2019   PLT 282 08/05/2019   GLUCOSE 137 (H) 08/05/2019   LDLDIRECT 102 (H) 08/05/2019   ALT 39 08/05/2019   AST 27 08/05/2019   NA 139 08/05/2019   K 4.4 08/05/2019   CL 102 08/05/2019   CREATININE 0.92 08/05/2019   BUN 13 08/05/2019   CO2 24 08/05/2019   INR 1.08 11/09/2017   HGBA1C 7.1 (H) 08/05/2019      Assessment & Plan:   1. Mixed hyperlipidemia - pravastatin (PRAVACHOL) 40 MG tablet; Take 1 tablet (40 mg total) by mouth daily.  Dispense: 90 tablet; Refill: 2 AN INDIVIDUAL CARE PLAN for hyperlipidemia/ cholesterol was established and reinforced today.  The patient's status was assessed using clinical findings on exam, lab and other diagnostic tests. The patient's disease status was assessed based on evidence-based guidelines and found to be well controlled. MEDICATIONS were reviewed. SELF MANAGEMENT GOALS have been discussed and patient's success at attaining the goal of low cholesterol was assessed. RECOMMENDATION given include regular exercise 3 days a week and low cholesterol/low fat diet. CLINICAL SUMMARY including written plan to identify barriers unique to the patient due to social or economic  reasons was discussed.  2. Bipolar 1 disorder (HCC) - FLUoxetine (PROZAC) 20 MG tablet; Take 1 tablet (20 mg total) by mouth daily.  Dispense: 30 tablet; Refill: 3 Patient is unsatble and did poorly on vraylar.  Start fluoxetine and seroquel to help control  3. Adjustment insomnia Add seroquel for his inability to turn "off" his mind  4. Chest pain, unspecified type - EKG 12-Lead - Ambulatory referral to Cardiology Ppsitive family history and multiple risk factors, refer to cadiology  5. Morbid  obesity ([place]) An individualize plan was formulated for obesity using patient history and physical exam to encourage weight loss.  An evidence based program was formulated.  Patient is to cut portion size with meals and to plan physical exercise 3 days a week at least 20 minutes.  Weight watchers and other programs are helpful.  Planned amount of weight loss 10 lbs.    Meds ordered this encounter  Medications  . pravastatin (PRAVACHOL) 40 MG tablet    Sig: Take 1 tablet (40 mg total) by mouth daily.    Dispense:  90 tablet    Refill:  2  . FLUoxetine (PROZAC) 20 MG tablet    Sig: Take 1 tablet (20 mg total) by mouth daily.    Dispense:  30 tablet    Refill:  3  . QUEtiapine (SEROQUEL) 50 MG tablet    Sig: Take 1 tablet (50 mg total) by mouth at bedtime.    Dispense:  30 tablet    Refill:  2    Orders Placed This Encounter  Procedures  . Ambulatory referral to Cardiology  . EKG 12-Lead     Follow-up: Return in about 2 weeks (around 02/04/2020) for bipolar.  An After Visit Summary was printed and given to the patient.  [place] [phone]

## 2020-01-23 HISTORY — DX: Morbid (severe) obesity due to excess calories: E66.01

## 2020-01-24 DIAGNOSIS — I10 Essential (primary) hypertension: Secondary | ICD-10-CM | POA: Insufficient documentation

## 2020-01-24 DIAGNOSIS — M549 Dorsalgia, unspecified: Secondary | ICD-10-CM | POA: Insufficient documentation

## 2020-01-25 ENCOUNTER — Ambulatory Visit: Payer: Medicare Other | Admitting: Cardiology

## 2020-01-27 ENCOUNTER — Other Ambulatory Visit (INDEPENDENT_AMBULATORY_CARE_PROVIDER_SITE_OTHER): Payer: Medicare Other

## 2020-01-27 DIAGNOSIS — U071 COVID-19: Secondary | ICD-10-CM

## 2020-01-27 DIAGNOSIS — Z20828 Contact with and (suspected) exposure to other viral communicable diseases: Secondary | ICD-10-CM

## 2020-01-27 NOTE — Progress Notes (Signed)
Patient Name: Todd Rose Date of Birth: April 06, 1976 MRN:  [id number]  Todd Rose is a 44 y.o. yo male presenting for COVID-19 testing.  He is being tested from the vehicle.  Todd Rose is being tested due to a positive exposure.  He denies any symptoms.  Patient understands that he will be notified of result as soon as we receive them, usually within 24-48 hours.  Printed and verbal information given to patient according to CDC Guidelines.  Erie Noe, LPN 4:83 AM

## 2020-01-29 LAB — NOVEL CORONAVIRUS, NAA: SARS-CoV-2, NAA: DETECTED — AB

## 2020-01-29 LAB — SARS-COV-2, NAA 2 DAY TAT

## 2020-01-30 NOTE — Progress Notes (Signed)
Positive covid test lp

## 2020-02-04 ENCOUNTER — Ambulatory Visit: Payer: Medicare Other | Admitting: Legal Medicine

## 2020-03-06 ENCOUNTER — Ambulatory Visit: Payer: Medicare Other | Admitting: Cardiology

## 2020-03-07 DIAGNOSIS — J209 Acute bronchitis, unspecified: Secondary | ICD-10-CM | POA: Diagnosis not present

## 2020-03-07 DIAGNOSIS — J0100 Acute maxillary sinusitis, unspecified: Secondary | ICD-10-CM | POA: Diagnosis not present

## 2020-03-14 ENCOUNTER — Other Ambulatory Visit: Payer: Self-pay

## 2020-03-14 ENCOUNTER — Encounter: Payer: Self-pay | Admitting: Legal Medicine

## 2020-03-14 ENCOUNTER — Ambulatory Visit (INDEPENDENT_AMBULATORY_CARE_PROVIDER_SITE_OTHER): Payer: Medicare Other | Admitting: Legal Medicine

## 2020-03-14 VITALS — BP 138/72 | HR 89 | Temp 97.5°F | Resp 16 | Ht 67.0 in | Wt 257.4 lb

## 2020-03-14 DIAGNOSIS — R059 Cough, unspecified: Secondary | ICD-10-CM | POA: Diagnosis not present

## 2020-03-14 DIAGNOSIS — J441 Chronic obstructive pulmonary disease with (acute) exacerbation: Secondary | ICD-10-CM

## 2020-03-14 DIAGNOSIS — J9811 Atelectasis: Secondary | ICD-10-CM | POA: Diagnosis not present

## 2020-03-14 MED ORDER — LEVOFLOXACIN 500 MG PO TABS: 500.0000 mg | ORAL_TABLET | Freq: Every day | ORAL | 0 refills | Status: DC

## 2020-03-14 NOTE — Progress Notes (Signed)
\   Acute Office Visit  Subjective:    Patient ID: Todd Rose, male    DOB: 06-Oct-1975, 44 y.o.   MRN: [id number]  Chief Complaint  Patient presents with   Cough    follow up from urgent care from [place] still there, congestion seems to be gone,using inhaler and finished meds given, chest burning and achey from coughing so much    HPI Patient is in today for follow up from urgent care.  He was treated for bronchitis one week ago in z-pack, albuterol and prednisone.  He is a chronic smoker and having some burning in chest.  Less cough.  Past Medical History:  Diagnosis Date   Back pain    Benign hypertension 08/05/2019   Bipolar 1 disorder ([name]) 08/05/2019   BPH with obstruction/lower urinary tract symptoms 06/21/2019   Chronic midline low back pain with right-sided sciatica 10/25/2019   Last Assessment & Plan:  Formatting of this note might be different from the original. This is a 44 year old gentleman with persistent and constant low back pain.  It has been going on for years but got worse over the last 2 years.  It goes down the side of his leg to his foot.  He is on disability for his back.  However he does have 5 children.  Some of those kids he has to lift up and take care    Cigarette nicotine dependence without complication 01/05/4157   DDD (degenerative disc disease), lumbar 04/29/2019   Last Assessment & Plan:  Very pleasant 44 year old overweight male with multilevel degenerative changes lumbar spine including degenerative disc disease, neural foraminal stenosis, ligamentum flavum hypertrophy and lumbar radiculopathy.  The patient underwent a right-sided L4-L5 lumbar epidural steroid injection in the clinic today without apparent complication.  He is a candidate for future injec   Diabetes mellitus without complication ([name])    Gastroenteritis 06/21/2019   Hypertension    Lumbar radiculopathy 04/29/2019   Last Assessment & Plan:  Continue Lyrica 150 mg b.i.d.    Mixed hyperlipidemia 08/05/2019   Morbid obesity ([name]) 01/23/2020   Neural foraminal stenosis of lumbar spine 07/20/4074   Nonalcoholic steatohepatitis ([facility])    Pain medication agreement 05/27/2019   Last Assessment & Plan:  Formatting of this note might be different from the original. UDS completed today.  Review of prior UDS results are within normal limits.   Primary osteoarthritis of right knee 12/16/2013   S/P arthroscopy of knee 12/16/2013   Spinal stenosis of lumbar region with neurogenic claudication 04/29/2019   Last Assessment & Plan:  See degenerative disc plan.   Type II diabetes mellitus with peripheral circulatory disorder ([name]) 08/05/2019    Past Surgical History:  Procedure Laterality Date   CHOLECYSTECTOMY     KNEE SURGERY     SHOULDER SURGERY     left    History reviewed. No pertinent family history.  Social History   Socioeconomic History   Marital status: Married    Spouse name: Not on file   Number of children: Not on file   Years of education: Not on file   Highest education level: Not on file  Occupational History   Not on file  Tobacco Use   Smoking status: Current Every Day Smoker    Packs/day: 0.50    Types: Cigarettes   Smokeless tobacco: Former Systems developer   Tobacco comment: 1 pk lasts 4-5 days.   Vaping Use   Vaping Use: Never used  Substance and Sexual Activity  Alcohol use: Not Currently   Drug use: Not Currently   Sexual activity: Yes    Partners: Female  Other Topics Concern   Not on file  Social History Narrative   Not on file   Social Determinants of Health   Financial Resource Strain:    Difficulty of Paying Living Expenses: Not on file  Food Insecurity:    Worried About [place] in the Last Year: Not on file   Ran Out of Food in the Last Year: Not on file  Transportation Needs:    Lack of Transportation (Medical): Not on file   Lack of Transportation (Non-Medical): Not on file  Physical  Activity:    Days of Exercise per Week: Not on file   Minutes of Exercise per Session: Not on file  Stress:    Feeling of Stress : Not on file  Social Connections:    Frequency of Communication with Friends and Family: Not on file   Frequency of Social Gatherings with Friends and Family: Not on file   Attends Religious Services: Not on file   Active Member of Clubs or Organizations: Not on file   Attends Archivist Meetings: Not on file   Marital Status: Not on file  Intimate Partner Violence:    Fear of Current or Ex-Partner: Not on file   Emotionally Abused: Not on file   Physically Abused: Not on file   Sexually Abused: Not on file    Outpatient Medications Prior to Visit  Medication Sig Dispense Refill   albuterol (VENTOLIN HFA) 108 (90 Base) MCG/ACT inhaler      FLUoxetine (PROZAC) 20 MG tablet Take 1 tablet (20 mg total) by mouth daily. 30 tablet 3   gabapentin (NEURONTIN) 800 MG tablet Take 1 tablet by mouth 4 times daily 120 tablet 4   HYDROcodone-acetaminophen (NORCO) 7.5-325 MG tablet Take 1 tablet by mouth every 6 (six) hours as needed for moderate pain.     ibuprofen (ADVIL) 200 MG tablet Take by mouth.     lisinopril (PRINIVIL,ZESTRIL) 40 MG tablet      metFORMIN (GLUCOPHAGE) 500 MG tablet Take 1 tablet (500 mg total) by mouth 2 (two) times daily with a meal. 180 tablet 2   methocarbamol (ROBAXIN) 750 MG tablet Take 750 mg by mouth every 6 (six) hours as needed.     naloxone (NARCAN) nasal spray 4 mg/0.1 mL Administer as directed as needed for narcotic overdose.     omeprazole (PRILOSEC) 40 MG capsule Take 40 mg by mouth 2 (two) times daily.     pravastatin (PRAVACHOL) 40 MG tablet Take 1 tablet (40 mg total) by mouth daily. 90 tablet 2   QUEtiapine (SEROQUEL) 50 MG tablet Take 1 tablet (50 mg total) by mouth at bedtime. (Patient not taking: Reported on 03/14/2020) 30 tablet 2   sulfamethoxazole-trimethoprim (BACTRIM DS) 800-160 MG  tablet Take 1 tablet by mouth 2 (two) times daily. (Patient not taking: Reported on 03/14/2020)     No facility-administered medications prior to visit.    Allergies  Allergen Reactions   Naproxen    Tramadol Other (See Comments)    Nausea and headache    Review of Systems  Constitutional: Negative.   HENT: Negative.   Respiratory: Positive for cough.   Cardiovascular: Negative.   Gastrointestinal: Negative.   Genitourinary: Negative.   Musculoskeletal: Negative.   Skin: Negative.   Neurological: Negative.   Psychiatric/Behavioral: Negative.        Objective:  Physical Exam Vitals reviewed.  Constitutional:      Appearance: Normal appearance.  HENT:     Right Ear: Tympanic membrane, ear canal and external ear normal.     Left Ear: Tympanic membrane, ear canal and external ear normal.     Mouth/Throat:     Mouth: Mucous membranes are moist.     Pharynx: Oropharynx is clear.  Eyes:     Extraocular Movements: Extraocular movements intact.     Conjunctiva/sclera: Conjunctivae normal.     Pupils: Pupils are equal, round, and reactive to light.  Cardiovascular:     Rate and Rhythm: Normal rate and regular rhythm.     Pulses: Normal pulses.     Heart sounds: Normal heart sounds.  Pulmonary:     Effort: Pulmonary effort is normal.     Breath sounds: Normal breath sounds.  Abdominal:     General: Abdomen is flat.     Palpations: Abdomen is soft.  Skin:    General: Skin is warm and dry.     Capillary Refill: Capillary refill takes less than 2 seconds.  Neurological:     Mental Status: He is alert.     BP 138/72    Pulse 89    Temp (!) 97.5 F (36.4 C)    Resp 16    Ht 5' 7"  (1.702 m)    Wt 257 lb 6.4 oz (116.8 kg)    SpO2 96%    BMI 40.31 kg/m  Wt Readings from Last 3 Encounters:  03/14/20 257 lb 6.4 oz (116.8 kg)  01/21/20 258 lb (117 kg)  08/05/19 260 lb (117.9 kg)    Health Maintenance Due  Topic Date Due   Hepatitis C Screening  Never done    PNEUMOCOCCAL POLYSACCHARIDE VACCINE AGE 76-64 HIGH RISK  Never done   FOOT EXAM  Never done   OPHTHALMOLOGY EXAM  Never done   COVID-19 Vaccine (1) Never done   INFLUENZA VACCINE  Never done   HEMOGLOBIN A1C  02/05/2020    There are no preventive care reminders to display for this patient.   No results found for: TSH Lab Results  Component Value Date   WBC 12.5 (H) 08/05/2019   HGB 14.2 08/05/2019   HCT 42.6 08/05/2019   MCV 88 08/05/2019   PLT 282 08/05/2019   Lab Results  Component Value Date   NA 139 08/05/2019   K 4.4 08/05/2019   CO2 24 08/05/2019   GLUCOSE 137 (H) 08/05/2019   BUN 13 08/05/2019   CREATININE 0.92 08/05/2019   BILITOT 0.9 08/05/2019   ALKPHOS 117 08/05/2019   AST 27 08/05/2019   ALT 39 08/05/2019   PROT 7.1 08/05/2019   ALBUMIN 4.4 08/05/2019   CALCIUM 9.8 08/05/2019   ANIONGAP 13 09/05/2018   No results found for: CHOL No results found for: HDL No results found for: LDLCALC No results found for: TRIG No results found for: CHOLHDL Lab Results  Component Value Date   HGBA1C 7.1 (H) 08/05/2019       Assessment & Plan:  1. Obstructive chronic bronchitis with exacerbation (HCC) - levofloxacin (LEVAQUIN) 500 MG tablet; Take 1 tablet (500 mg total) by mouth daily.  Dispense: 7 tablet; Refill: 0 - DG Chest 2 View Z-pack did not help , start levaquin and get chest x-ray today.   Meds ordered this encounter  Medications   levofloxacin (LEVAQUIN) 500 MG tablet    Sig: Take 1 tablet (500 mg total) by mouth daily.  Dispense:  7 tablet    Refill:  0    Orders Placed This Encounter  Procedures   DG Chest 2 View     Follow-up: Return if symptoms worsen or fail to improve.  An After Visit Summary was printed and given to the patient.  [place] [phone]

## 2020-03-15 ENCOUNTER — Other Ambulatory Visit: Payer: Self-pay | Admitting: Legal Medicine

## 2020-03-22 ENCOUNTER — Telehealth: Payer: Self-pay

## 2020-03-22 NOTE — Telephone Encounter (Signed)
Dr. Henrene Pastor please advise.  Pt is complaining of left heel pain, pt states he has been resting it when he can and keeping it elevated when possible an even soaking it in Epsom salt. Pt is asking if he needs to come see you for this or if you should send him to a podiatrist or how that works. Pt stated he was just in here but didn't mention this at his appointment an is willing to come in if you want to see him before any referrals but wanted to make sure to save money.

## 2020-03-28 ENCOUNTER — Other Ambulatory Visit: Payer: Self-pay

## 2020-03-28 ENCOUNTER — Emergency Department (HOSPITAL_COMMUNITY): Payer: Medicare Other

## 2020-03-28 ENCOUNTER — Emergency Department (HOSPITAL_COMMUNITY)
Admission: EM | Admit: 2020-03-28 | Discharge: 2020-03-28 | Disposition: A | Discharge status: Home / Self Care | Payer: Medicare Other | Attending: Emergency Medicine | Admitting: Emergency Medicine

## 2020-03-28 ENCOUNTER — Encounter (HOSPITAL_COMMUNITY): Payer: Self-pay

## 2020-03-28 DIAGNOSIS — Z7984 Long term (current) use of oral hypoglycemic drugs: Secondary | ICD-10-CM | POA: Insufficient documentation

## 2020-03-28 DIAGNOSIS — R1011 Right upper quadrant pain: Secondary | ICD-10-CM

## 2020-03-28 DIAGNOSIS — Z79899 Other long term (current) drug therapy: Secondary | ICD-10-CM | POA: Insufficient documentation

## 2020-03-28 DIAGNOSIS — R112 Nausea with vomiting, unspecified: Secondary | ICD-10-CM | POA: Insufficient documentation

## 2020-03-28 DIAGNOSIS — E1151 Type 2 diabetes mellitus with diabetic peripheral angiopathy without gangrene: Secondary | ICD-10-CM | POA: Insufficient documentation

## 2020-03-28 DIAGNOSIS — F17210 Nicotine dependence, cigarettes, uncomplicated: Secondary | ICD-10-CM | POA: Insufficient documentation

## 2020-03-28 DIAGNOSIS — I10 Essential (primary) hypertension: Secondary | ICD-10-CM | POA: Diagnosis not present

## 2020-03-28 DIAGNOSIS — R109 Unspecified abdominal pain: Secondary | ICD-10-CM | POA: Diagnosis not present

## 2020-03-28 DIAGNOSIS — K529 Noninfective gastroenteritis and colitis, unspecified: Secondary | ICD-10-CM | POA: Insufficient documentation

## 2020-03-28 DIAGNOSIS — K760 Fatty (change of) liver, not elsewhere classified: Secondary | ICD-10-CM | POA: Diagnosis not present

## 2020-03-28 DIAGNOSIS — R1110 Vomiting, unspecified: Secondary | ICD-10-CM | POA: Diagnosis not present

## 2020-03-28 LAB — COMPREHENSIVE METABOLIC PANEL
ALT: 35 U/L (ref 0–44)
AST: 23 U/L (ref 15–41)
Albumin: 3.9 g/dL (ref 3.5–5.0)
Alkaline Phosphatase: 85 U/L (ref 38–126)
Anion gap: 10 (ref 5–15)
BUN: 9 mg/dL (ref 6–20)
CO2: 27 mmol/L (ref 22–32)
Calcium: 9.3 mg/dL (ref 8.9–10.3)
Chloride: 103 mmol/L (ref 98–111)
Creatinine, Ser: 0.85 mg/dL (ref 0.61–1.24)
GFR, Estimated: >60 mL/min (ref >60)
Glucose, Bld: 168 mg/dL — ABNORMAL HIGH (ref 70–99)
Potassium: 3.9 mmol/L (ref 3.5–5.1)
Sodium: 140 mmol/L (ref 135–145)
Total Bilirubin: 0.9 mg/dL (ref 0.3–1.2)
Total Protein: 7.1 g/dL (ref 6.5–8.1)

## 2020-03-28 LAB — CBC
HCT: 43.5 % (ref 39.0–52.0)
Hemoglobin: 14 g/dL (ref 13.0–17.0)
MCH: 29 pg (ref 26.0–34.0)
MCHC: 32.2 g/dL (ref 30.0–36.0)
MCV: 90.2 fL (ref 80.0–100.0)
Platelets: 255 10*3/uL (ref 150–400)
RBC: 4.82 MIL/uL (ref 4.22–5.81)
RDW: 13.2 % (ref 11.5–15.5)
WBC: 10.4 10*3/uL (ref 4.0–10.5)
nRBC: 0 % (ref 0.0–0.2)

## 2020-03-28 LAB — URINALYSIS, ROUTINE W REFLEX MICROSCOPIC
Bilirubin Urine: NEGATIVE
Glucose, UA: NEGATIVE mg/dL
Hgb urine dipstick: NEGATIVE
Ketones, ur: NEGATIVE mg/dL
Leukocytes,Ua: NEGATIVE
Nitrite: NEGATIVE
Protein, ur: NEGATIVE mg/dL
Specific Gravity, Urine: 1.021 (ref 1.005–1.030)
pH: 5 (ref 5.0–8.0)

## 2020-03-28 LAB — LIPASE, BLOOD: Lipase: 43 U/L (ref 11–51)

## 2020-03-28 MED ORDER — FENTANYL CITRATE (PF) 100 MCG/2ML IJ SOLN
100.0000 ug | Freq: Once | INTRAMUSCULAR | Status: AC
  Administered 2020-03-28: 100 ug via INTRAVENOUS
  Filled 2020-03-28: qty 2

## 2020-03-28 MED ORDER — SODIUM CHLORIDE 0.9 % IV BOLUS
1000.0000 mL | Freq: Once | INTRAVENOUS | Status: AC
  Administered 2020-03-28: 1000 mL via INTRAVENOUS

## 2020-03-28 MED ORDER — ONDANSETRON 4 MG PO TBDP: 4.0000 mg | ORAL_TABLET | Freq: Three times a day (TID) | ORAL | 0 refills | Status: DC | PRN

## 2020-03-28 MED ORDER — IOHEXOL 300 MG/ML  SOLN
100.0000 mL | Freq: Once | INTRAMUSCULAR | Status: AC | PRN
  Administered 2020-03-28: 100 mL via INTRAVENOUS

## 2020-03-28 MED ORDER — ONDANSETRON HCL 4 MG/2ML IJ SOLN
4.0000 mg | Freq: Once | INTRAMUSCULAR | Status: AC
  Administered 2020-03-28: 4 mg via INTRAVENOUS
  Filled 2020-03-28: qty 2

## 2020-03-28 MED ORDER — HYDROMORPHONE HCL 1 MG/ML IJ SOLN
1.0000 mg | Freq: Once | INTRAMUSCULAR | Status: AC
  Administered 2020-03-28: 1 mg via INTRAVENOUS
  Filled 2020-03-28: qty 1

## 2020-03-28 NOTE — ED Provider Notes (Signed)
[facility] EMERGENCY DEPARTMENT Provider Note   CSN: [id number] Arrival date & time: 03/28/20  0845     History Chief Complaint  Patient presents with  . Abdominal Pain    Todd Rose is a 44 y.o. male with friend past medical history of hypertension, bipolar 1, BPH,  morbid obesity, type 2 diabetes,NASH, hyperlipidemia that presents emerge department today for right-sided abdominal pain with associated nausea and vomiting since last Tuesday, has been one week.  Patient states that he had had a cholecystectomy many years ago, states that his pain in his right upper quadrant/epigastrium.  Does not radiate anywhere.  Also admits to nausea and vomiting, states that he vomited 5 times yesterday.  No hematemesis.  Denies any diarrhea.  Denies any fevers or chills.  Denies any sick contacts.  States that he has not been able to eat due to the pain and the nausea, has tried ice and heat without any relief.  Is on a pain contract, takes hydrocodone frequently for his back, states that that has not been helping his abdominal pain at all.  Denies any dysuria, hematuria.  No testicular pain or scrotal pain/swelling.  Denies any other abdominal surgeries besides cholecystectomy.  Denies any alcohol or drug use, says he does use marijuana.   HPI     Past Medical History:  Diagnosis Date  . Back pain   . Benign hypertension 08/05/2019  . Bipolar 1 disorder ([place]) 08/05/2019  . BPH with obstruction/lower urinary tract symptoms 06/21/2019  . Chronic midline low back pain with right-sided sciatica 10/25/2019   Last Assessment & Plan:  Formatting of this note might be different from the original. This is a 44 year old gentleman with persistent and constant low back pain.  It has been going on for years but got worse over the last 2 years.  It goes down the side of his leg to his foot.  He is on disability for his back.  However he does have 5 children.  Some of those kids he has to lift up and  take care   . Cigarette nicotine dependence without complication 08/09/5186  . DDD (degenerative disc disease), lumbar 04/29/2019   Last Assessment & Plan:  Very pleasant 44 year old overweight male with multilevel degenerative changes lumbar spine including degenerative disc disease, neural foraminal stenosis, ligamentum flavum hypertrophy and lumbar radiculopathy.  The patient underwent a right-sided L4-L5 lumbar epidural steroid injection in the clinic today without apparent complication.  He is a candidate for future injec  . Diabetes mellitus without complication ([place])   . Gastroenteritis 06/21/2019  . Hypertension   . Lumbar radiculopathy 04/29/2019   Last Assessment & Plan:  Continue Lyrica 150 mg b.i.d.  . Mixed hyperlipidemia 08/05/2019  . Morbid obesity ([place]) 01/23/2020  . Neural foraminal stenosis of lumbar spine 10/01/2019  . Nonalcoholic steatohepatitis (NASH)   . Pain medication agreement 05/27/2019   Last Assessment & Plan:  Formatting of this note might be different from the original. UDS completed today.  Review of prior UDS results are within normal limits.  . Primary osteoarthritis of right knee 12/16/2013  . S/P arthroscopy of knee 12/16/2013  . Spinal stenosis of lumbar region with neurogenic claudication 04/29/2019   Last Assessment & Plan:  See degenerative disc plan.  . Type II diabetes mellitus with peripheral circulatory disorder ([place]) 08/05/2019    Patient Active Problem List   Diagnosis Date Noted  . Hypertension   . Diabetes mellitus without complication ([place])   .  Back pain   . Morbid obesity ([place]) 01/23/2020  . Cigarette nicotine dependence without complication 09/32/3557  . Chronic midline low back pain with right-sided sciatica 10/25/2019  . Neural foraminal stenosis of lumbar spine 10/01/2019  . Type II diabetes mellitus with peripheral circulatory disorder ([place]) 08/05/2019  . Benign hypertension 08/05/2019  . Bipolar 1 disorder ([place]) 08/05/2019  . Mixed  hyperlipidemia 08/05/2019  . Nonalcoholic steatohepatitis (NASH)   . Gastroenteritis 06/21/2019  . BPH with obstruction/lower urinary tract symptoms 06/21/2019  . Pain medication agreement 05/27/2019  . DDD (degenerative disc disease), lumbar 04/29/2019  . Lumbar radiculopathy 04/29/2019  . Spinal stenosis of lumbar region with neurogenic claudication 04/29/2019  . Primary osteoarthritis of right knee 12/16/2013  . S/P arthroscopy of knee 12/16/2013    Past Surgical History:  Procedure Laterality Date  . BACK SURGERY  12/28/2019  . CHOLECYSTECTOMY    . KNEE SURGERY    . SHOULDER SURGERY     left       No family history on file.  Social History   Tobacco Use  . Smoking status: Current Every Day Smoker    Packs/day: 0.50    Types: Cigarettes  . Smokeless tobacco: Former Systems developer  . Tobacco comment: 1 pk lasts 4-5 days.   Vaping Use  . Vaping Use: Never used  Substance Use Topics  . Alcohol use: Not Currently  . Drug use: Not Currently    Home Medications Prior to Admission medications   Medication Sig Start Date End Date Taking? Authorizing Provider  albuterol (VENTOLIN HFA) 108 (90 Base) MCG/ACT inhaler  03/07/20   [provider]  FLUoxetine (PROZAC) 20 MG tablet Take 1 tablet (20 mg total) by mouth daily. 01/21/20   Lillard Anes, MD  gabapentin (NEURONTIN) 800 MG tablet Take 1 tablet by mouth 4 times daily 10/25/19   Lillard Anes, MD  HYDROcodone-acetaminophen ([place]) 7.5-325 MG tablet Take 1 tablet by mouth every 6 (six) hours as needed for moderate pain.    [provider]  ibuprofen (ADVIL) 200 MG tablet Take by mouth. 01/02/20   [provider]  levofloxacin (LEVAQUIN) 500 MG tablet Take 1 tablet (500 mg total) by mouth daily. 03/14/20   Lillard Anes, MD  lisinopril (ZESTRIL) 40 MG tablet Take 1 tablet by mouth once daily 03/15/20   Lillard Anes, MD  lisinopril (ZESTRIL) 40 MG tablet Take 40 mg by mouth.  02/13/20   [provider]  metFORMIN (GLUCOPHAGE) 500 MG tablet Take 1 tablet (500 mg total) by mouth 2 (two) times daily with a meal. 08/05/19   Lillard Anes, MD  methocarbamol (ROBAXIN) 750 MG tablet Take 750 mg by mouth every 6 (six) hours as needed. 01/19/20   [provider]  naloxone [facility] nasal spray 4 mg/0.1 mL Administer as directed as needed for narcotic overdose. 12/28/19   [provider]  omeprazole (PRILOSEC) 40 MG capsule Take 40 mg by mouth 2 (two) times daily. 04/13/19   [provider]  ondansetron (ZOFRAN ODT) 4 MG disintegrating tablet Take 1 tablet (4 mg total) by mouth every 8 (eight) hours as needed for nausea or vomiting. 03/28/20   Alfredia Client, PA-C  pravastatin (PRAVACHOL) 40 MG tablet Take 1 tablet (40 mg total) by mouth daily. 01/21/20   Lillard Anes, MD    Allergies    Naproxen and Tramadol  Review of Systems   Review of Systems  Constitutional: Negative for chills, diaphoresis, fatigue and fever.  HENT: Negative for congestion, sore throat and trouble swallowing.   Eyes: Negative for pain and visual disturbance.  Respiratory: Negative for cough, shortness of breath and wheezing.   Cardiovascular: Negative for chest pain, palpitations and leg swelling.  Gastrointestinal: Positive for abdominal pain, nausea and vomiting. Negative for abdominal distention and diarrhea.  Genitourinary: Negative for difficulty urinating.  Musculoskeletal: Negative for back pain, neck pain and neck stiffness.  Skin: Negative for pallor.  Neurological: Negative for dizziness, speech difficulty, weakness and headaches.  Psychiatric/Behavioral: Negative for confusion.    Physical Exam Updated Vital Signs BP (!) 138/106   Pulse 81   Temp 98.3 F (36.8 C) (Oral)   Resp 14   Ht 5' 7"  (1.702 m)   Wt 114.8 kg   SpO2 96%   BMI 39.63 kg/m   Physical Exam Constitutional:      General: He is not in acute distress.     Appearance: Normal appearance. He is not ill-appearing, toxic-appearing or diaphoretic.  HENT:     Mouth/Throat:     Mouth: Mucous membranes are moist.     Pharynx: Oropharynx is clear.  Eyes:     General: No scleral icterus.    Extraocular Movements: Extraocular movements intact.     Pupils: Pupils are equal, round, and reactive to light.  Cardiovascular:     Rate and Rhythm: Normal rate and regular rhythm.     Pulses: Normal pulses.     Heart sounds: Normal heart sounds.  Pulmonary:     Effort: Pulmonary effort is normal. No respiratory distress.     Breath sounds: Normal breath sounds. No stridor. No wheezing, rhonchi or rales.  Chest:     Chest wall: No tenderness.  Abdominal:     General: Abdomen is flat. Bowel sounds are normal. There is no distension.     Palpations: Abdomen is soft.     Tenderness: There is abdominal tenderness in the right upper quadrant and epigastric area. There is no guarding or rebound. Negative signs include Murphy's sign, Rovsing's sign and McBurney's sign.  Musculoskeletal:        General: No swelling or tenderness. Normal range of motion.     Cervical back: Normal range of motion and neck supple. No rigidity.     Right lower leg: No edema.     Left lower leg: No edema.  Skin:    General: Skin is warm and dry.     Capillary Refill: Capillary refill takes less than 2 seconds.     Coloration: Skin is not pale.  Neurological:     General: No focal deficit present.     Mental Status: He is alert and oriented to person, place, and time.  Psychiatric:        Mood and Affect: Mood normal.        Behavior: Behavior normal.     ED Results / Procedures / Treatments   Labs (all labs ordered are listed, but only abnormal results are displayed) Labs Reviewed  COMPREHENSIVE METABOLIC PANEL - Abnormal; Notable for the following components:      Result Value   Glucose, Bld 168 (*)    All other components within normal limits  LIPASE, BLOOD  CBC    URINALYSIS, ROUTINE W REFLEX MICROSCOPIC    EKG EKG Interpretation  Date/Time:  Tuesday March 28 2020 10:10:18 EST Ventricular Rate:  70 PR Interval:    QRS Duration: 100 QT Interval:  365 QTC Calculation: 394 R Axis:   21  Text Interpretation: Sinus arrhythmia normal, no change from previous Confirmed by Charlesetta Shanks [phone]) on 03/28/2020 10:13:12 AM   Radiology CT Abdomen Pelvis W Contrast  Result Date: 03/28/2020 CLINICAL DATA:  Epigastric and right upper quadrant abdominal pain with nausea and vomiting. History of cholecystectomy. EXAM: CT ABDOMEN AND PELVIS WITH CONTRAST TECHNIQUE: Multidetector CT imaging of the abdomen and pelvis was performed using the standard protocol following bolus administration of intravenous contrast. CONTRAST:  110m OMNIPAQUE IOHEXOL 300 MG/ML  SOLN COMPARISON:  09/04/2018, 11/09/2017 FINDINGS: Lower chest: 3 mm noncalcified pulmonary nodule within the posterior right hepatic lobe (series 5, image 5), unchanged from 2019 and considered benign. Visualized lung bases are otherwise clear. Heart size is normal. Hepatobiliary: Diffusely decreased attenuation of the parenchyma compatible with hepatic steatosis. No liver lesion is identified. Status post cholecystectomy. No biliary dilatation. Pancreas: Unremarkable. No pancreatic ductal dilatation or surrounding inflammatory changes. Spleen: Normal in size without focal abnormality. Adrenals/Urinary Tract: Unremarkable adrenal glands. Kidneys enhance symmetrically without focal lesion, stone, or hydronephrosis. Ureters are nondilated. Urinary bladder appears unremarkable. Stomach/Bowel: Stomach is within normal limits. Appendix appears normal (series 6, image 58). No dilated loops of small bowel. There is mild wall thickening of the ascending colon without pericolonic inflammatory changes or fluid. Remainder of the colon is within normal limits. Vascular/Lymphatic: Negative for abdominal aortic aneurysm. Minimal  scattered atherosclerotic calcification. Mildly prominent bilateral external iliac chain lymph nodes have slightly decreased in caliber compared to previous study of 2019. Reproductive: Prostate is unremarkable. Other: No free fluid. No abdominopelvic fluid collection. No pneumoperitoneum. No abdominal wall hernia. Musculoskeletal: Postsurgical changes from right laminectomy. No new or acute osseous findings. IMPRESSION: 1. Mild wall thickening of the ascending colon. Findings may represent a mild infectious or inflammatory colitis. 2. Hepatic steatosis. 3. Prior cholecystectomy. Electronically Signed   By: NDavina PokeD.O.   On: 03/28/2020 13:07    Procedures Procedures (including critical care time)  Medications Ordered in ED Medications  fentaNYL (SUBLIMAZE) injection 100 mcg (100 mcg Intravenous Given 03/28/20 1030)  ondansetron (ZOFRAN) injection 4 mg (4 mg Intravenous Given 03/28/20 1030)  sodium chloride 0.9 % bolus 1,000 mL (0 mLs Intravenous Stopped 03/28/20 1158)  HYDROmorphone (DILAUDID) injection 1 mg (1 mg Intravenous Given 03/28/20 1159)  iohexol (OMNIPAQUE) 300 MG/ML solution 100 mL (100 mLs Intravenous Contrast Given 03/28/20 1247)    ED Course  I have reviewed the triage vital signs and the nursing notes.  Pertinent labs & imaging results that were available during my care of the patient were reviewed by me and considered in my medical decision making (see chart for details).    MDM Rules/Calculators/A&P                          JXzander Gilhamis a 44y.o. male with friend past medical history of hypertension, bipolar 1, BPH,  morbid obesity, type 2 diabetes,NASH, hyperlipidemia that presents emerge department today for right-sided abdominal pain with associated nausea and vomiting since last Tuesday.  Differential diagnoses considered include choledocholithiasis, pancreatitis, gastroenteritis, hepatitis.  Initial interventions fentanyl and Zofran and fluids.   Labs  unremarkable, no leukocytosis, normal lipase.  Urinalysis clean.  No transaminase elevation, no concerns for obstruction at this time.  CT abdomen does show some mild ascending colitis, this is where patient is having pain therefore I think that this could be related to that.  Patient symptoms are not infectious, patient does not have any diarrhea, is afebrile  and does not have a white count.  Symptoms are most likely inflammatory, symptomatic treatment discussed.  Do not think we need follow-up right upper quadrant ultrasound emergently, patient family signs of obstruction with normal white count and has had a cholecystectomy. Patient will follow up with GI.  Upon reassessment patient states that he feels much better with pain medication and nausea medication on board.  Ready for discharge.  Doubt need for further emergent work up at this time. I explained the diagnosis and have given explicit precautions to return to the ER including for any other new or worsening symptoms. The patient understands and accepts the medical plan as it's been dictated and I have answered their questions. Discharge instructions concerning home care and prescriptions have been given. The patient is STABLE and is discharged to home in good condition.    Final Clinical Impression(s) / ED Diagnoses Final diagnoses:  Right upper quadrant abdominal pain    Rx / DC Orders ED Discharge Orders         Ordered    ondansetron (ZOFRAN ODT) 4 MG disintegrating tablet  Every 8 hours PRN        03/28/20 1446           Alfredia Client, PA-C 03/28/20 1537    Charlesetta Shanks, MD 03/31/20 [phone]

## 2020-03-28 NOTE — ED Triage Notes (Signed)
Pt reports right sided abd pain with n/v since last Tuesday. Softer stools. No urinary symptoms. Pt tearful in triage.

## 2020-03-28 NOTE — Discharge Instructions (Addendum)
You were seen today for abdominal pain, this is most likely due to your mild colitis as we spoke about.  Your work-up today was reassuring, your glucose was mildly elevated I would talk to your PCP about repeat testing for your sugar.  I want you to follow-up with [facility] gastroenterology, as we spoke about.  Take ibuprofen or Tylenol as directed on the bottle for pain.  I also prescribed you some Zofran you can take as needed for nausea.  Please come back to the emergency department for any new worsening concerning symptoms.

## 2020-03-29 ENCOUNTER — Emergency Department (HOSPITAL_COMMUNITY)
Admission: EM | Admit: 2020-03-29 | Discharge: 2020-03-29 | Disposition: A | Discharge status: Home / Self Care | Payer: Medicare Other | Attending: Emergency Medicine | Admitting: Emergency Medicine

## 2020-03-29 ENCOUNTER — Other Ambulatory Visit: Payer: Self-pay

## 2020-03-29 ENCOUNTER — Encounter (HOSPITAL_COMMUNITY): Payer: Self-pay | Admitting: Emergency Medicine

## 2020-03-29 ENCOUNTER — Ambulatory Visit: Payer: Medicare Other | Admitting: Cardiology

## 2020-03-29 ENCOUNTER — Emergency Department (HOSPITAL_COMMUNITY): Payer: Medicare Other

## 2020-03-29 DIAGNOSIS — R0789 Other chest pain: Secondary | ICD-10-CM | POA: Insufficient documentation

## 2020-03-29 DIAGNOSIS — R1013 Epigastric pain: Secondary | ICD-10-CM | POA: Diagnosis not present

## 2020-03-29 DIAGNOSIS — Z5321 Procedure and treatment not carried out due to patient leaving prior to being seen by health care provider: Secondary | ICD-10-CM | POA: Insufficient documentation

## 2020-03-29 LAB — COMPREHENSIVE METABOLIC PANEL
ALT: 35 U/L (ref 0–44)
AST: 23 U/L (ref 15–41)
Albumin: 3.9 g/dL (ref 3.5–5.0)
Alkaline Phosphatase: 82 U/L (ref 38–126)
Anion gap: 10 (ref 5–15)
BUN: 10 mg/dL (ref 6–20)
CO2: 28 mmol/L (ref 22–32)
Calcium: 9.3 mg/dL (ref 8.9–10.3)
Chloride: 100 mmol/L (ref 98–111)
Creatinine, Ser: 0.96 mg/dL (ref 0.61–1.24)
GFR, Estimated: >60 mL/min (ref >60)
Glucose, Bld: 109 mg/dL — ABNORMAL HIGH (ref 70–99)
Potassium: 3.5 mmol/L (ref 3.5–5.1)
Sodium: 138 mmol/L (ref 135–145)
Total Bilirubin: 0.8 mg/dL (ref 0.3–1.2)
Total Protein: 6.8 g/dL (ref 6.5–8.1)

## 2020-03-29 LAB — CBC
HCT: 41.3 % (ref 39.0–52.0)
Hemoglobin: 13.4 g/dL (ref 13.0–17.0)
MCH: 29.4 pg (ref 26.0–34.0)
MCHC: 32.4 g/dL (ref 30.0–36.0)
MCV: 90.6 fL (ref 80.0–100.0)
Platelets: 260 10*3/uL (ref 150–400)
RBC: 4.56 MIL/uL (ref 4.22–5.81)
RDW: 13.2 % (ref 11.5–15.5)
WBC: 9.8 10*3/uL (ref 4.0–10.5)
nRBC: 0 % (ref 0.0–0.2)

## 2020-03-29 LAB — LIPASE, BLOOD: Lipase: 21 U/L (ref 11–51)

## 2020-03-29 LAB — TROPONIN I (HIGH SENSITIVITY): Troponin I (High Sensitivity): 3 ng/L (ref <18)

## 2020-03-29 NOTE — ED Triage Notes (Signed)
Patient complains of an intermittent chest pressure that started around last Tuesday. States he came for evaluation today because the pain usually goes away with a hot shower or cannabis but today the pain is 8/10 and nothing improves it. Patient alert and oriented and in no apparent distress at this time.

## 2020-03-29 NOTE — ED Notes (Signed)
Pt threw stickers in trash can and walked out left ama

## 2020-03-30 DIAGNOSIS — R1110 Vomiting, unspecified: Secondary | ICD-10-CM | POA: Diagnosis not present

## 2020-03-30 DIAGNOSIS — K760 Fatty (change of) liver, not elsewhere classified: Secondary | ICD-10-CM | POA: Diagnosis not present

## 2020-03-30 DIAGNOSIS — R109 Unspecified abdominal pain: Secondary | ICD-10-CM | POA: Diagnosis not present

## 2020-03-30 DIAGNOSIS — R1011 Right upper quadrant pain: Secondary | ICD-10-CM | POA: Diagnosis not present

## 2020-03-30 DIAGNOSIS — R112 Nausea with vomiting, unspecified: Secondary | ICD-10-CM | POA: Diagnosis not present

## 2020-04-01 ENCOUNTER — Emergency Department (HOSPITAL_COMMUNITY): Payer: Medicare Other

## 2020-04-01 ENCOUNTER — Encounter (HOSPITAL_COMMUNITY): Payer: Self-pay | Admitting: *Deleted

## 2020-04-01 ENCOUNTER — Other Ambulatory Visit: Payer: Self-pay

## 2020-04-01 ENCOUNTER — Emergency Department (HOSPITAL_COMMUNITY)
Admission: EM | Admit: 2020-04-01 | Discharge: 2020-04-01 | Disposition: A | Discharge status: Home / Self Care | Payer: Medicare Other | Attending: Emergency Medicine | Admitting: Emergency Medicine

## 2020-04-01 DIAGNOSIS — E1151 Type 2 diabetes mellitus with diabetic peripheral angiopathy without gangrene: Secondary | ICD-10-CM | POA: Diagnosis not present

## 2020-04-01 DIAGNOSIS — Z7984 Long term (current) use of oral hypoglycemic drugs: Secondary | ICD-10-CM | POA: Insufficient documentation

## 2020-04-01 DIAGNOSIS — I10 Essential (primary) hypertension: Secondary | ICD-10-CM | POA: Diagnosis not present

## 2020-04-01 DIAGNOSIS — R112 Nausea with vomiting, unspecified: Secondary | ICD-10-CM | POA: Diagnosis not present

## 2020-04-01 DIAGNOSIS — Z79899 Other long term (current) drug therapy: Secondary | ICD-10-CM | POA: Insufficient documentation

## 2020-04-01 DIAGNOSIS — K760 Fatty (change of) liver, not elsewhere classified: Secondary | ICD-10-CM | POA: Diagnosis not present

## 2020-04-01 DIAGNOSIS — R109 Unspecified abdominal pain: Secondary | ICD-10-CM | POA: Diagnosis present

## 2020-04-01 DIAGNOSIS — F17210 Nicotine dependence, cigarettes, uncomplicated: Secondary | ICD-10-CM | POA: Diagnosis not present

## 2020-04-01 DIAGNOSIS — R1084 Generalized abdominal pain: Secondary | ICD-10-CM | POA: Insufficient documentation

## 2020-04-01 LAB — COMPREHENSIVE METABOLIC PANEL
ALT: 36 U/L (ref 0–44)
AST: 22 U/L (ref 15–41)
Albumin: 4 g/dL (ref 3.5–5.0)
Alkaline Phosphatase: 85 U/L (ref 38–126)
Anion gap: 9 (ref 5–15)
BUN: 11 mg/dL (ref 6–20)
CO2: 31 mmol/L (ref 22–32)
Calcium: 9.3 mg/dL (ref 8.9–10.3)
Chloride: 99 mmol/L (ref 98–111)
Creatinine, Ser: 0.95 mg/dL (ref 0.61–1.24)
GFR, Estimated: >60 mL/min (ref >60)
Glucose, Bld: 156 mg/dL — ABNORMAL HIGH (ref 70–99)
Potassium: 3.6 mmol/L (ref 3.5–5.1)
Sodium: 139 mmol/L (ref 135–145)
Total Bilirubin: 1.3 mg/dL — ABNORMAL HIGH (ref 0.3–1.2)
Total Protein: 7 g/dL (ref 6.5–8.1)

## 2020-04-01 LAB — URINALYSIS, ROUTINE W REFLEX MICROSCOPIC
Bacteria, UA: NONE SEEN
Bilirubin Urine: NEGATIVE
Glucose, UA: NEGATIVE mg/dL
Hgb urine dipstick: NEGATIVE
Ketones, ur: 20 mg/dL — AB
Leukocytes,Ua: NEGATIVE
Nitrite: NEGATIVE
Protein, ur: 30 mg/dL — AB
Specific Gravity, Urine: 1.026 (ref 1.005–1.030)
pH: 6 (ref 5.0–8.0)

## 2020-04-01 LAB — CBC
HCT: 43.3 % (ref 39.0–52.0)
Hemoglobin: 13.9 g/dL (ref 13.0–17.0)
MCH: 28.9 pg (ref 26.0–34.0)
MCHC: 32.1 g/dL (ref 30.0–36.0)
MCV: 90 fL (ref 80.0–100.0)
Platelets: 268 10*3/uL (ref 150–400)
RBC: 4.81 MIL/uL (ref 4.22–5.81)
RDW: 13.3 % (ref 11.5–15.5)
WBC: 11.2 10*3/uL — ABNORMAL HIGH (ref 4.0–10.5)
nRBC: 0 % (ref 0.0–0.2)

## 2020-04-01 LAB — LIPASE, BLOOD: Lipase: 25 U/L (ref 11–51)

## 2020-04-01 MED ORDER — PANTOPRAZOLE SODIUM 40 MG IV SOLR
40.0000 mg | Freq: Once | INTRAVENOUS | Status: AC
  Administered 2020-04-01: 40 mg via INTRAVENOUS
  Filled 2020-04-01: qty 40

## 2020-04-01 MED ORDER — DICYCLOMINE HCL 20 MG PO TABS: 20.0000 mg | ORAL_TABLET | Freq: Two times a day (BID) | ORAL | 0 refills | Status: DC

## 2020-04-01 MED ORDER — PROMETHAZINE HCL 25 MG/ML IJ SOLN
25.0000 mg | Freq: Once | INTRAMUSCULAR | Status: AC
  Administered 2020-04-01: 25 mg via INTRAVENOUS
  Filled 2020-04-01: qty 1

## 2020-04-01 MED ORDER — MORPHINE SULFATE (PF) 4 MG/ML IV SOLN
4.0000 mg | Freq: Once | INTRAVENOUS | Status: AC
  Administered 2020-04-01: 4 mg via INTRAVENOUS
  Filled 2020-04-01: qty 1

## 2020-04-01 MED ORDER — METOCLOPRAMIDE HCL 5 MG/ML IJ SOLN
10.0000 mg | Freq: Once | INTRAMUSCULAR | Status: AC
  Administered 2020-04-01: 10 mg via INTRAVENOUS
  Filled 2020-04-01: qty 2

## 2020-04-01 MED ORDER — SODIUM CHLORIDE 0.9 % IV BOLUS
1000.0000 mL | Freq: Once | INTRAVENOUS | Status: AC
  Administered 2020-04-01: 1000 mL via INTRAVENOUS

## 2020-04-01 MED ORDER — ALUM & MAG HYDROXIDE-SIMETH 200-200-20 MG/5ML PO SUSP
30.0000 mL | Freq: Once | ORAL | Status: AC
  Administered 2020-04-01: 30 mL via ORAL
  Filled 2020-04-01: qty 30

## 2020-04-01 MED ORDER — LIDOCAINE VISCOUS HCL 2 % MT SOLN
15.0000 mL | Freq: Once | OROMUCOSAL | Status: AC
  Administered 2020-04-01: 15 mL via ORAL
  Filled 2020-04-01: qty 15

## 2020-04-01 MED ORDER — DICYCLOMINE HCL 10 MG/ML IM SOLN
20.0000 mg | Freq: Once | INTRAMUSCULAR | Status: AC
  Administered 2020-04-01: 20 mg via INTRAMUSCULAR
  Filled 2020-04-01: qty 2

## 2020-04-01 MED ORDER — ONDANSETRON 4 MG PO TBDP
4.0000 mg | ORAL_TABLET | Freq: Once | ORAL | Status: AC | PRN
  Administered 2020-04-01: 4 mg via ORAL
  Filled 2020-04-01: qty 1

## 2020-04-01 MED ORDER — IOHEXOL 300 MG/ML  SOLN
100.0000 mL | Freq: Once | INTRAMUSCULAR | Status: AC | PRN
  Administered 2020-04-01: 100 mL via INTRAVENOUS

## 2020-04-01 MED ORDER — ONDANSETRON 4 MG PO TBDP
4.0000 mg | ORAL_TABLET | Freq: Once | ORAL | Status: AC | PRN
  Administered 2020-04-01: 4 mg via ORAL

## 2020-04-01 MED ORDER — METOCLOPRAMIDE HCL 10 MG PO TABS: 10.0000 mg | ORAL_TABLET | Freq: Three times a day (TID) | ORAL | 0 refills | Status: DC | PRN

## 2020-04-01 NOTE — ED Provider Notes (Addendum)
[facility] EMERGENCY DEPARTMENT Provider Note   CSN: [id number] Arrival date & time: 04/01/20  1287     History Chief Complaint  Patient presents with  . Abdominal Pain    Todd Rose is a 44 y.o. male.  Todd Rose is a 44 y.o. male with a history of hypertension, diabetes, nonalcoholic steatohepatitis, gastroenteritis, chronic back pain, BPH, bipolar disorder, obesity, who presents to the emergency department for evaluation of persistent abdominal pain, nausea and vomiting.  Patient has been seen multiple times for this recently. Was last seen on the ED on 11/16 and had an overall reassuring work-up, did show some signs of possible mild inflammatory or infectious colitis, patient was discharged home with symptomatic treatment.  He returned on 11/17 due to continued and worsening symptoms but left prior to being seen by provider.  Patient has also been seen at [facility], states they did lab work imaging, and told him they got him an appointment at a GI doctor, but when he arrived at the office, they said that they did not have any availability to see him that day, he was unable to get an appointment until December 8.  He has been trying to use Zofran, but it has not helped his nausea and vomiting.  The symptoms have been ongoing now for about 2 weeks, he has been able to keep down very little food or fluids.  Denies any hematemesis.  He fluctuates between having diarrhea and constipation.  Reports abdominal pain is soft and generalized but now seems to be worse in the lower abdomen in particular on the right.  He has not had any fevers or chills.  Patient is very frustrated, he cannot seem to get any answers as to what is causing the symptoms meanwhile he continues to have pain and not be able to eat or drink anything.  He has not been able to get into see a specialist more quickly and just does not know what to do at this point.       Past Medical History:   Diagnosis Date  . Back pain   . Benign hypertension 08/05/2019  . Bipolar 1 disorder ([place]) 08/05/2019  . BPH with obstruction/lower urinary tract symptoms 06/21/2019  . Chronic midline low back pain with right-sided sciatica 10/25/2019   Last Assessment & Plan:  Formatting of this note might be different from the original. This is a 44 year old gentleman with persistent and constant low back pain.  It has been going on for years but got worse over the last 2 years.  It goes down the side of his leg to his foot.  He is on disability for his back.  However he does have 5 children.  Some of those kids he has to lift up and take care   . Cigarette nicotine dependence without complication 8/67/6720  . DDD (degenerative disc disease), lumbar 04/29/2019   Last Assessment & Plan:  Very pleasant 44 year old overweight male with multilevel degenerative changes lumbar spine including degenerative disc disease, neural foraminal stenosis, ligamentum flavum hypertrophy and lumbar radiculopathy.  The patient underwent a right-sided L4-L5 lumbar epidural steroid injection in the clinic today without apparent complication.  He is a candidate for future injec  . Diabetes mellitus without complication ([place])   . Gastroenteritis 06/21/2019  . Hypertension   . Lumbar radiculopathy 04/29/2019   Last Assessment & Plan:  Continue Lyrica 150 mg b.i.d.  . Mixed hyperlipidemia 08/05/2019  . Morbid obesity ([place]) 01/23/2020  .  Neural foraminal stenosis of lumbar spine 10/01/2019  . Nonalcoholic steatohepatitis ([facility])   . Pain medication agreement 05/27/2019   Last Assessment & Plan:  Formatting of this note might be different from the original. UDS completed today.  Review of prior UDS results are within normal limits.  . Primary osteoarthritis of right knee 12/16/2013  . S/P arthroscopy of knee 12/16/2013  . Spinal stenosis of lumbar region with neurogenic claudication 04/29/2019   Last Assessment & Plan:  See degenerative disc plan.   . Type II diabetes mellitus with peripheral circulatory disorder ([facility]) 08/05/2019    Patient Active Problem List   Diagnosis Date Noted  . Hypertension   . Diabetes mellitus without complication ([place])   . Back pain   . Morbid obesity ([place]) 01/23/2020  . Cigarette nicotine dependence without complication 46/27/0350  . Chronic midline low back pain with right-sided sciatica 10/25/2019  . Neural foraminal stenosis of lumbar spine 10/01/2019  . Type II diabetes mellitus with peripheral circulatory disorder ([place]) 08/05/2019  . Benign hypertension 08/05/2019  . Bipolar 1 disorder ([place]) 08/05/2019  . Mixed hyperlipidemia 08/05/2019  . Nonalcoholic steatohepatitis ([facility])   . Gastroenteritis 06/21/2019  . BPH with obstruction/lower urinary tract symptoms 06/21/2019  . Pain medication agreement 05/27/2019  . DDD (degenerative disc disease), lumbar 04/29/2019  . Lumbar radiculopathy 04/29/2019  . Spinal stenosis of lumbar region with neurogenic claudication 04/29/2019  . Primary osteoarthritis of right knee 12/16/2013  . S/P arthroscopy of knee 12/16/2013    Past Surgical History:  Procedure Laterality Date  . BACK SURGERY  12/28/2019  . CHOLECYSTECTOMY    . KNEE SURGERY    . SHOULDER SURGERY     left       No family history on file.  Social History   Tobacco Use  . Smoking status: Current Every Day Smoker    Packs/day: 0.50    Types: Cigarettes  . Smokeless tobacco: Former Systems developer  . Tobacco comment: 1 pk lasts 4-5 days.   Vaping Use  . Vaping Use: Never used  Substance Use Topics  . Alcohol use: Not Currently  . Drug use: Not Currently    Home Medications Prior to Admission medications   Medication Sig Start Date End Date Taking? Authorizing Provider  gabapentin (NEURONTIN) 800 MG tablet Take 1 tablet by mouth 4 times daily Patient taking differently: Take 800 mg by mouth in the morning, at noon, in the evening, and at bedtime.  10/25/19  Yes Lillard Anes, MD   HYDROcodone-acetaminophen (NORCO) 7.5-325 MG tablet Take 1 tablet by mouth every 6 (six) hours as needed for moderate pain.   Yes [provider]  ibuprofen (ADVIL) 200 MG tablet Take 200 mg by mouth every 6 (six) hours as needed for moderate pain.  01/02/20  Yes [provider]  lisinopril (ZESTRIL) 40 MG tablet Take 1 tablet by mouth once daily 03/15/20  Yes Lillard Anes, MD  metFORMIN (GLUCOPHAGE) 500 MG tablet Take 1 tablet (500 mg total) by mouth 2 (two) times daily with a meal. 08/05/19  Yes Lillard Anes, MD  omeprazole (PRILOSEC) 40 MG capsule Take 40 mg by mouth 2 (two) times daily. 04/13/19  Yes [provider]  ondansetron (ZOFRAN ODT) 4 MG disintegrating tablet Take 1 tablet (4 mg total) by mouth every 8 (eight) hours as needed for nausea or vomiting. 03/28/20  Yes Alfredia Client, PA-C  pantoprazole (PROTONIX) 40 MG tablet Take 40 mg by mouth daily. 03/31/20  Yes [provider]  pravastatin (PRAVACHOL) 40 MG tablet Take 1 tablet (40 mg total) by mouth daily. 01/21/20  Yes Lillard Anes, MD  sucralfate (CARAFATE) 1 g tablet Take 1 g by mouth 3 (three) times daily. 03/31/20  Yes [provider]  dicyclomine (BENTYL) 20 MG tablet Take 1 tablet (20 mg total) by mouth 2 (two) times daily. 04/01/20   Jacqlyn Larsen, PA-C  FLUoxetine (PROZAC) 20 MG tablet Take 1 tablet (20 mg total) by mouth daily. Patient not taking: Reported on 04/01/2020 01/21/20   Lillard Anes, MD  lisinopril (ZESTRIL) 40 MG tablet Take 40 mg by mouth. Patient not taking: Reported on 04/01/2020 02/13/20   [provider]  methocarbamol (ROBAXIN) 750 MG tablet Take 750 mg by mouth every 6 (six) hours as needed. Patient not taking: Reported on 04/01/2020 01/19/20   [provider]  metoCLOPramide (REGLAN) 10 MG tablet Take 1 tablet (10 mg total) by mouth every 8 (eight) hours as needed for nausea (nausea/headache). 04/01/20   Jacqlyn Larsen, PA-C  naloxone [facility]) nasal spray 4 mg/0.1 mL Place 1 spray into the nose once.  12/28/19   [provider]    Allergies    Naproxen and Tramadol  Review of Systems   Review of Systems  Constitutional: Negative for chills and fever.  HENT: Negative.   Respiratory: Negative for cough and shortness of breath.   Gastrointestinal: Positive for abdominal pain, constipation, diarrhea, nausea and vomiting. Negative for blood in stool.  Genitourinary: Negative for dysuria, flank pain, frequency and hematuria.  Musculoskeletal: Negative for arthralgias and myalgias.  Skin: Negative for color change and rash.  Neurological: Negative for dizziness, syncope and light-headedness.    Physical Exam Updated Vital Signs BP (!) 144/112 (BP Location: Right Arm)   Pulse 81   Temp 98.3 F (36.8 C)   Resp 17   Ht 5' 7"  (1.702 m)   Wt 114.8 kg   SpO2 99%   BMI 39.63 kg/m   Physical Exam Vitals and nursing note reviewed.  Constitutional:      General: He is not in acute distress.    Appearance: He is well-developed. He is obese. He is not diaphoretic.     Comments: Patient appears uncomfortable and is frustrated, but is in no acute distress.  HENT:     Head: Normocephalic and atraumatic.     Mouth/Throat:     Comments: Mucous membranes slightly dry, posterior oropharynx clear Eyes:     General:        Right eye: No discharge.        Left eye: No discharge.  Cardiovascular:     Rate and Rhythm: Normal rate and regular rhythm.     Heart sounds: Normal heart sounds. No murmur heard.  No friction rub. No gallop.   Pulmonary:     Effort: Pulmonary effort is normal. No respiratory distress.     Breath sounds: Normal breath sounds. No wheezing or rales.     Comments: Respirations equal and unlabored, patient able to speak in full sentences, lungs clear to auscultation bilaterally Abdominal:     General: Bowel sounds are normal. There is no distension.     Palpations:  Abdomen is soft. There is no mass.     Tenderness: There is abdominal tenderness.     Comments: Abdomen is soft and nondistended, bowel sounds present throughout, there is some mild generalized tenderness, tenderness most notably in the right lower quadrant slight guarding, no  CVA tenderness bilaterally.  Musculoskeletal:        General: No deformity.     Cervical back: Neck supple.  Skin:    General: Skin is warm and dry.     Capillary Refill: Capillary refill takes less than 2 seconds.  Neurological:     Mental Status: He is alert.     Coordination: Coordination normal.     Comments: Speech is clear, able to follow commands Moves extremities without ataxia, coordination intact  Psychiatric:        Mood and Affect: Mood normal.        Behavior: Behavior normal.     ED Results / Procedures / Treatments   Labs (all labs ordered are listed, but only abnormal results are displayed) Labs Reviewed  COMPREHENSIVE METABOLIC PANEL - Abnormal; Notable for the following components:      Result Value   Glucose, Bld 156 (*)    Total Bilirubin 1.3 (*)    All other components within normal limits  CBC - Abnormal; Notable for the following components:   WBC 11.2 (*)    All other components within normal limits  URINALYSIS, ROUTINE W REFLEX MICROSCOPIC - Abnormal; Notable for the following components:   Ketones, ur 20 (*)    Protein, ur 30 (*)    All other components within normal limits  LIPASE, BLOOD    EKG None  Radiology CT ABDOMEN PELVIS W CONTRAST  Result Date: 04/01/2020 CLINICAL DATA:  44 year old male with acute RIGHT abdominal and pelvic pain. Recent back surgery. EXAM: CT ABDOMEN AND PELVIS WITH CONTRAST TECHNIQUE: Multidetector CT imaging of the abdomen and pelvis was performed using the standard protocol following bolus administration of intravenous contrast. CONTRAST:  115m OMNIPAQUE IOHEXOL 300 MG/ML  SOLN COMPARISON:  03/30/2020 CT FINDINGS: Lower chest: No acute  abnormality. Hepatobiliary: Hepatic steatosis noted without suspicious focal hepatic abnormality. The patient is status post cholecystectomy. No biliary dilatation. Pancreas: Unremarkable Spleen: Unremarkable Adrenals/Urinary Tract: The kidneys, adrenal glands and bladder are unremarkable. Stomach/Bowel: Stomach is within normal limits. Appendix appears normal. No evidence of bowel wall thickening, distention, or inflammatory changes. Vascular/Lymphatic: Aortic atherosclerosis. No enlarged abdominal or pelvic lymph nodes. Reproductive: Prostate is unremarkable. Other: No ascites, pneumoperitoneum or focal collection. Musculoskeletal: Lumbar spine surgical changes are noted. No acute or suspicious bony abnormalities noted. IMPRESSION: 1. No evidence of acute abnormality. 2. Hepatic steatosis. 3. Lumbar spine surgical changes. 4. Aortic Atherosclerosis (ICD10-I70.0). Electronically Signed   By: JMargarette CanadaM.D.   On: 04/01/2020 10:32    Procedures Procedures (including critical care time)  Medications Ordered in ED Medications  ondansetron (ZOFRAN-ODT) disintegrating tablet 4 mg (4 mg Oral Given 04/01/20 0716)  ondansetron (ZOFRAN-ODT) disintegrating tablet 4 mg (4 mg Oral Given 04/01/20 0920)  sodium chloride 0.9 % bolus 1,000 mL (0 mLs Intravenous Stopped 04/01/20 1121)  morphine 4 MG/ML injection 4 mg (4 mg Intravenous Given 04/01/20 0925)  pantoprazole (PROTONIX) injection 40 mg (40 mg Intravenous Given 04/01/20 0918)  promethazine (PHENERGAN) injection 25 mg (25 mg Intravenous Given 04/01/20 0920)  iohexol (OMNIPAQUE) 300 MG/ML solution 100 mL (100 mLs Intravenous Contrast Given 04/01/20 0945)  metoCLOPramide (REGLAN) injection 10 mg (10 mg Intravenous Given 04/01/20 1130)  dicyclomine (BENTYL) injection 20 mg (20 mg Intramuscular Given 04/01/20 1133)  alum & mag hydroxide-simeth (MAALOX/MYLANTA) 200-200-20 MG/5ML suspension 30 mL (30 mLs Oral Given 04/01/20 1135)    And  lidocaine (XYLOCAINE)  2 % viscous mouth solution 15 mL (15 mLs Oral Given 04/01/20  1135)    ED Course  I have reviewed the triage vital signs and the nursing notes.  Pertinent labs & imaging results that were available during my care of the patient were reviewed by me and considered in my medical decision making (see chart for details).    MDM Rules/Calculators/A&P                         Patient presents to the ED with complaints of abdominal pain. Patient nontoxic appearing, in no apparent distress, vitals WNL aside from some hypertension, he was unable to keep down his blood pressure medications today. On exam patient tender to throughout the abdomen, but most notably in the right lower quadrant with slight guarding, no peritoneal signs.  Given prior CT showed signs of colitis, and he now has more focal abdominal tenderness I do think it is reasonable to repeat CT to look for signs of worsening colitis versus development of new acute intra-abdominal pathology.  Will evaluate with labs and CT. Analgesics, anti-emetics, and fluids administered.   ER work-up reviewed:  CBC: Mild leukocytosis of 11.2, normal hemoglobin CMP: Glucose of 156, no other significant electrolyte derangements, normal renal function, bili of 1.3, no other abnormal LFTs Lipase: WNL UA: No evidence of infection  After initial round of medications patient does report some improvement but is still having some discomfort and nausea, will give additional antiemetics, as well as Bentyl, Protonix and GI cocktail  CT without evidence of persistent colitis or any other new or acute findings within the abdomen or pelvis.  Known hepatic steatosis appears unchanged, no evidence of appendicitis, diverticulitis.  Discussed CT results with patient.  Feel he would certainly benefit from further evaluation with GI.  On repeat abdominal exam patient remains without peritoneal signs, doubt cholecystitis, pancreatitis, diverticulitis, appendicitis, bowel  obstruction/perforation. Patient tolerating PO in the emergency department and reports improvement in symptoms.  Patient has an aversion towards trying to eat anything with concern that it will cause his symptoms again, but was able to eat some crackers here in the ED, encouraged him to eat small bland frequent meals, his stomach has not had much on it over the past 2 weeks and it will take time for it to readjust.  Will discharge home with supportive measures. I discussed results, treatment plan, need for GI follow-up, and return precautions with the patient. Provided opportunity for questions, patient confirmed understanding and is in agreement with plan.    Final Clinical Impression(s) / ED Diagnoses Final diagnoses:  Generalized abdominal pain  Non-intractable vomiting with nausea, unspecified vomiting type    Rx / DC Orders ED Discharge Orders         Ordered    metoCLOPramide (REGLAN) 10 MG tablet  Every 8 hours PRN        04/01/20 1303    dicyclomine (BENTYL) 20 MG tablet  2 times daily        04/01/20 1303           Jacqlyn Larsen, [place] 04/04/20 1143    Jacqlyn Larsen, PA-C 04/04/20 1149    Sherwood Gambler, MD 04/07/20 1057

## 2020-04-01 NOTE — ED Triage Notes (Signed)
Patient states he was seen in the ED last week with abd. Pain was seen at [facility] several days ago and pain is more intense today, states he was referred to GI however he will not be able to get into see until Dec. 8

## 2020-04-01 NOTE — Discharge Instructions (Signed)
Your repeat CT today does not show signs of colitis and is otherwise reassuring.  Your lab work overall looks good.  You will need to follow-up with GI as planned for continued evaluation of this abdominal pain.  In the meantime you can use Bentyl to help with abdominal cramping and discomfort.  If you are not having regular bowel movements you can use 1 capful of MiraLAX daily.  You can use Reglan to help with nausea and vomiting.  Please try and eat small frequent meals with bland foods and plenty of clear liquids to help your stomach adjust as you have not been able to eat or drink much recently.  Return for new or worsening symptoms.

## 2020-04-01 NOTE — ED Notes (Signed)
Pt discharge instructions and prescriptions reviewed with the patient. The patient verbalized understanding of both. Pt discharged. 

## 2020-04-01 NOTE — ED Notes (Signed)
Provided pt with ice water and crackers for PO challenge

## 2020-04-13 ENCOUNTER — Ambulatory Visit: Payer: Medicare Other | Admitting: Cardiology

## 2020-04-13 ENCOUNTER — Encounter: Payer: Self-pay | Admitting: Cardiology

## 2020-04-13 ENCOUNTER — Other Ambulatory Visit: Payer: Self-pay

## 2020-04-13 ENCOUNTER — Ambulatory Visit (INDEPENDENT_AMBULATORY_CARE_PROVIDER_SITE_OTHER): Payer: Medicare Other

## 2020-04-13 VITALS — BP 118/82 | HR 78 | Ht 67.0 in | Wt 251.0 lb

## 2020-04-13 DIAGNOSIS — R0683 Snoring: Secondary | ICD-10-CM | POA: Diagnosis not present

## 2020-04-13 DIAGNOSIS — R400 Somnolence: Secondary | ICD-10-CM | POA: Diagnosis not present

## 2020-04-13 DIAGNOSIS — R0789 Other chest pain: Secondary | ICD-10-CM

## 2020-04-13 DIAGNOSIS — R5383 Other fatigue: Secondary | ICD-10-CM | POA: Diagnosis not present

## 2020-04-13 DIAGNOSIS — R079 Chest pain, unspecified: Secondary | ICD-10-CM | POA: Insufficient documentation

## 2020-04-13 DIAGNOSIS — F17200 Nicotine dependence, unspecified, uncomplicated: Secondary | ICD-10-CM

## 2020-04-13 DIAGNOSIS — R002 Palpitations: Secondary | ICD-10-CM | POA: Diagnosis not present

## 2020-04-13 DIAGNOSIS — E669 Obesity, unspecified: Secondary | ICD-10-CM | POA: Insufficient documentation

## 2020-04-13 DIAGNOSIS — F32A Depression, unspecified: Secondary | ICD-10-CM

## 2020-04-13 DIAGNOSIS — R0602 Shortness of breath: Secondary | ICD-10-CM | POA: Insufficient documentation

## 2020-04-13 DIAGNOSIS — F4321 Adjustment disorder with depressed mood: Secondary | ICD-10-CM

## 2020-04-13 MED ORDER — METOPROLOL TARTRATE 100 MG PO TABS: 100.0000 mg | ORAL_TABLET | Freq: Once | ORAL | 0 refills | Status: AC

## 2020-04-13 NOTE — Progress Notes (Signed)
Cardiology Office Note:    Date:  04/13/2020   ID:  Todd Rose, DOB August 22, 1975, MRN [id number]  PCP:  Lillard Anes, MD  Cardiologist:  Berniece Salines, DO  Electrophysiologist:  None   Referring MD: Lillard Anes,*   Chief Complaint  Patient presents with  . Fatigue  . Irregular Heart Beat    History of Present Illness:    Todd Rose is a 44 y.o. male with a hx of diabetes mellitus, hypertension, bipolar disorder, hyperlipidemia, obesity, current smoker, family history of premature coronary artery disease is here today to establish cardiac care.  The patient tells me that over the last few months he has experienced intermittent chest discomfort.  He describes it as a painful sensation midsternal that does not radiate.  It last a few seconds and then improves.  He also has had some associated shortness of breath.  He tells me fatigue is 1 symptoms that he is experiencing and is worsening.  He notes that he snores and he had significant daytime somnolence. The issue that brought him here primarily was the fact that he is having excess extremely fast heartbeat on a daily basis.  He described this as an abrupt fast heart rate which last for few minutes sometimes he feels lightheaded and short of breath and may go for about 30 minutes sometimes for hours prior to resolution.  Nothing makes it better or worse. He is concerned about all of his symptoms as he has had a big loss in his family with his history of 77 which autopsy is felt to be from myocardial infarction. He also is having some stressful times with his family life and he is tearful as he tells me this.  Past Medical History:  Diagnosis Date  . Back pain   . Benign hypertension 08/05/2019  . Bipolar 1 disorder ([place]) 08/05/2019  . BPH with obstruction/lower urinary tract symptoms 06/21/2019  . Chronic midline low back pain with right-sided sciatica 10/25/2019   Last Assessment & Plan:  Formatting of this note might be  different from the original. This is a 44 year old gentleman with persistent and constant low back pain.  It has been going on for years but got worse over the last 2 years.  It goes down the side of his leg to his foot.  He is on disability for his back.  However he does have 5 children.  Some of those kids he has to lift up and take care   . Cigarette nicotine dependence without complication 8/52/7782  . DDD (degenerative disc disease), lumbar 04/29/2019   Last Assessment & Plan:  Very pleasant 44 year old overweight male with multilevel degenerative changes lumbar spine including degenerative disc disease, neural foraminal stenosis, ligamentum flavum hypertrophy and lumbar radiculopathy.  The patient underwent a right-sided L4-L5 lumbar epidural steroid injection in the clinic today without apparent complication.  He is a candidate for future injec  . Diabetes mellitus without complication ([place])   . Gastroenteritis 06/21/2019  . Hypertension   . Lumbar radiculopathy 04/29/2019   Last Assessment & Plan:  Continue Lyrica 150 mg b.i.d.  . Mixed hyperlipidemia 08/05/2019  . Morbid obesity ([place]) 01/23/2020  . Neural foraminal stenosis of lumbar spine 10/01/2019  . Nonalcoholic steatohepatitis ([facility])   . Pain medication agreement 05/27/2019   Last Assessment & Plan:  Formatting of this note might be different from the original. UDS completed today.  Review of prior UDS results are within normal limits.  . Primary osteoarthritis of  right knee 12/16/2013  . S/P arthroscopy of knee 12/16/2013  . Spinal stenosis of lumbar region with neurogenic claudication 04/29/2019   Last Assessment & Plan:  See degenerative disc plan.  . Type II diabetes mellitus with peripheral circulatory disorder ([place]) 08/05/2019    Past Surgical History:  Procedure Laterality Date  . BACK SURGERY  12/28/2019  . CHOLECYSTECTOMY    . KNEE SURGERY    . SHOULDER SURGERY     left    Current Medications: Current Meds  Medication Sig   . dicyclomine (BENTYL) 20 MG tablet Take 1 tablet (20 mg total) by mouth 2 (two) times daily.  Marland Kitchen gabapentin (NEURONTIN) 800 MG tablet Take 1 tablet by mouth 4 times daily (Patient taking differently: Take 800 mg by mouth in the morning, at noon, in the evening, and at bedtime. )  . HYDROcodone-acetaminophen (NORCO) 7.5-325 MG tablet Take 1 tablet by mouth every 6 (six) hours as needed for moderate pain.  Marland Kitchen ibuprofen (ADVIL) 200 MG tablet Take 200 mg by mouth every 6 (six) hours as needed for moderate pain.   Marland Kitchen lisinopril (ZESTRIL) 40 MG tablet Take 1 tablet by mouth once daily  . omeprazole (PRILOSEC) 40 MG capsule Take 40 mg by mouth 2 (two) times daily.  . pantoprazole (PROTONIX) 40 MG tablet Take 40 mg by mouth daily.  . pravastatin (PRAVACHOL) 40 MG tablet Take 1 tablet (40 mg total) by mouth daily.     Allergies:   Naproxen and Tramadol   Social History   Socioeconomic History  . Marital status: Married    Spouse name: Not on file  . Number of children: Not on file  . Years of education: Not on file  . Highest education level: Not on file  Occupational History  . Not on file  Tobacco Use  . Smoking status: Current Every Day Smoker    Packs/day: 0.50    Types: Cigarettes  . Smokeless tobacco: Former Systems developer  . Tobacco comment: 1 pk lasts 4-5 days.   Vaping Use  . Vaping Use: Never used  Substance and Sexual Activity  . Alcohol use: Not Currently  . Drug use: Not Currently  . Sexual activity: Yes    Partners: Female  Other Topics Concern  . Not on file  Social History Narrative  . Not on file   Social Determinants of Health   Financial Resource Strain:   . Difficulty of Paying Living Expenses: Not on file  Food Insecurity:   . Worried About Charity fundraiser in the Last Year: Not on file  . Ran Out of Food in the Last Year: Not on file  Transportation Needs:   . Lack of Transportation (Medical): Not on file  . Lack of Transportation (Non-Medical): Not on file    Physical Activity:   . Days of Exercise per Week: Not on file  . Minutes of Exercise per Session: Not on file  Stress:   . Feeling of Stress : Not on file  Social Connections:   . Frequency of Communication with Friends and Family: Not on file  . Frequency of Social Gatherings with Friends and Family: Not on file  . Attends Religious Services: Not on file  . Active Member of Clubs or Organizations: Not on file  . Attends Archivist Meetings: Not on file  . Marital Status: Not on file     Family History: The patient's family history includes Cancer in his mother.  ROS:   Review  of Systems  Constitution: Negative for decreased appetite, fever and weight gain.  HENT: Negative for congestion, ear discharge, hoarse voice and sore throat.   Eyes: Negative for discharge, redness, vision loss in right eye and visual halos.  Cardiovascular: Reports chest pain, dyspnea on exertion, palpitations.  Negative for leg swelling, orthopnea . Respiratory: Negative for cough, hemoptysis, shortness of breath and snoring.   Endocrine: Negative for heat intolerance and polyphagia.  Hematologic/Lymphatic: Negative for bleeding problem. Does not bruise/bleed easily.  Skin: Negative for flushing, nail changes, rash and suspicious lesions.  Musculoskeletal: Negative for arthritis, joint pain, muscle cramps, myalgias, neck pain and stiffness.  Gastrointestinal: Negative for abdominal pain, bowel incontinence, diarrhea and excessive appetite.  Genitourinary: Negative for decreased libido, genital sores and incomplete emptying.  Neurological: Negative for brief paralysis, focal weakness, headaches and loss of balance.  Psychiatric/Behavioral: Negative for altered mental status, depression and suicidal ideas.  Allergic/Immunologic: Negative for HIV exposure and persistent infections.    EKGs/Labs/Other Studies Reviewed:    The following studies were reviewed today:   EKG:  The ekg ordered today  demonstrates sinus rhythm, heart rate 74 beats per minute.  Recent Labs: 04/01/2020: ALT 36; BUN 11; Creatinine, Ser 0.95; Hemoglobin 13.9; Platelets 268; Potassium 3.6; Sodium 139  Recent Lipid Panel    Component Value Date/Time   LDLDIRECT 102 (H) 08/05/2019 1020    Physical Exam:    VS:  BP 118/82 (BP Location: Left Arm, Patient Position: Sitting)   Pulse 78   Ht 5' 7"  (1.702 m)   Wt 251 lb (113.9 kg)   SpO2 98%   BMI 39.31 kg/m     Wt Readings from Last 3 Encounters:  04/13/20 251 lb (113.9 kg)  04/01/20 253 lb (114.8 kg)  03/28/20 253 lb (114.8 kg)     GEN: Well nourished, well developed in no acute distress HEENT: Normal NECK: No JVD; No carotid bruits LYMPHATICS: No lymphadenopathy CARDIAC: S1S2 noted,RRR, no murmurs, rubs, gallops RESPIRATORY:  Clear to auscultation without rales, wheezing or rhonchi  ABDOMEN: Soft, non-tender, non-distended, +bowel sounds, no guarding. EXTREMITIES: No edema, No cyanosis, no clubbing MUSCULOSKELETAL:  No deformity  SKIN: Warm and dry NEUROLOGIC:  Alert and oriented x 3, non-focal PSYCHIATRIC:  Normal affect, good insight  ASSESSMENT:    1. Chest discomfort   2. Palpitations   3. Fatigue, unspecified type   4. Daytime somnolence   5. Snoring   6. Chest pain of uncertain etiology   7. Obesity (BMI 30-39.9)   8. Depression, unspecified depression type   9. Grief   10. Shortness of breath    PLAN:     His chest pain is concerning given the patient is intermediate to high risk for coronary artery disease (diabetes mellitus, high blood pressure, smoker, premature family history of coronary artery disease and hyperlipidemia).  I like to pursue an ischemic evaluation in this patient we discussed their testing modalities shared decision a coronary CTA would be appropriate at this time.  He has no IV contrast dye allergy he is agreeable to proceed with this testing.  I would like to rule out a cardiovascular etiology of this  palpitation, therefore at this time I would like to placed a zio patch for 7 days.   In additon for his shortness of breath and fatigue a transthoracic echocardiogram will be ordered to assess LV/RV function and any structural abnormalities.   He is at very high risk for sleep apnea and he does have symptoms of  snoring and daytime somnolence and therefore like to proceed with testing for sleep apnea in this patient.  Vitamin D testing will also be done today.   Once these testing have been performed amd reviewed further reccomendations will be made. For now, I do reccomend that the patient goes to the nearest ED if  symptoms recur.  The patient was counseled on tobacco cessation today for 5 minutes.  Counseling included reviewing the risks of smoking tobacco products, how it impacts the patient's current medical diagnoses and different strategies for quitting.  Pharmacotherapy to aid in tobacco cessation was not prescribed today. The patient coordinate with  primary care provider.  The patient was also advised to call  1-800-QUIT-NOW (918)363-0356) for additional help with quitting smoking.  The patient understands the need to lose weight with diet and exercise. We have discussed specific strategies for this.  The patient has also been referred to behavioral health he is experiencing significant depression he is teary when he talk to me about his family situation as well as he would benefit from grief counseling.  The patient is in agreement with the above plan. The patient left the office in stable condition.  The patient will follow up in 3 months or sooner if needed   Medication Adjustments/Labs and Tests Ordered: Current medicines are reviewed at length with the patient today.  Concerns regarding medicines are outlined above.  Orders Placed This Encounter  Procedures  . CT CORONARY MORPH W/CTA COR W/SCORE W/CA W/CM &/OR WO/CM  . CT CORONARY FRACTIONAL FLOW RESERVE DATA PREP  . CT  CORONARY FRACTIONAL FLOW RESERVE FLUID ANALYSIS  . Vitamin D 1,25 dihydroxy  . Basic metabolic panel  . Ambulatory referral to [facility]  . LONG TERM MONITOR (3-14 DAYS)  . ECHOCARDIOGRAM COMPLETE  . Split night study   Meds ordered this encounter  Medications  . metoprolol tartrate (LOPRESSOR) 100 MG tablet    Sig: Take 1 tablet (100 mg total) by mouth once for 1 dose. 2 hours before ct.    Dispense:  1 tablet    Refill:  0    Patient Instructions  Medication Instructions:  Your physician recommends that you continue on your current medications as directed. Please refer to the Current Medication list given to you today.  *If you need a refill on your cardiac medications before your next appointment, please call your pharmacy*   Lab Work: Your physician recommends that you return for lab work today: vitamin d   Your physician recommends that you return for lab work: 3-7 days before ct : bmp   If you have labs (blood work) drawn today and your tests are completely normal, you will receive your results only by: Marland Kitchen MyChart Message (if you have MyChart) OR . A paper copy in the mail If you have any lab test that is abnormal or we need to change your treatment, we will call you to review the results.   Testing/Procedures: Your physician has requested that you have an echocardiogram. Echocardiography is a painless test that uses sound waves to create images of your heart. It provides your doctor with information about the size and shape of your heart and how well your heart's chambers and valves are working. This procedure takes approximately one hour. There are no restrictions for this procedure.  A zio monitor was ordered today. It will remain on for 7 days. You will then return monitor and event diary in provided box. It takes 1-2 weeks for  report to be downloaded and returned to [place]. We will call you with the results. If monitor falls off or has orange flashing light, please  call Zio for further instructions.   Your physician has recommended that you have a sleep study. This test records several body functions during sleep, including: brain activity, eye movement, oxygen and carbon dioxide blood levels, heart rate and rhythm, breathing rate and rhythm, the flow of air through your mouth and nose, snoring, body muscle movements, and chest and belly movement.  Your cardiac CT will be scheduled at one of the below locations:   [facility] [address] [place], [place] [address] [phone]  [facility] [address] [place], [place] [address] [phone]  If scheduled at [facility], please arrive at the [facility] main entrance of [facility] 30 minutes prior to test start time. Proceed to the [facility] Radiology Department (first floor) to check-in and test prep.  If scheduled at [facility], please arrive 15 mins early for check-in and test prep.  Please follow these instructions carefully (unless otherwise directed):  Hold all erectile dysfunction medications at least 3 days (72 hrs) prior to test.  On the Night Before the Test: . Be sure to Drink plenty of water. . Do not consume any caffeinated/decaffeinated beverages or chocolate 12 hours prior to your test. . Do not take any antihistamines 12 hours prior to your test.  On the Day of the Test: . Drink plenty of water. Do not drink any water within one hour of the test. . Do not eat any food 4 hours prior to the test. . You may take your regular medications prior to the test.  . Take metoprolol (Lopressor) two hours prior to test.          After the Test: . Drink plenty of water. . After receiving IV contrast, you may experience a mild flushed feeling. This is normal. . On occasion, you may experience a mild rash up to 24 hours after the test. This is not dangerous. If this  occurs, you can take Benadryl 25 mg and increase your fluid intake. . If you experience trouble breathing, this can be serious. If it is severe call 911 IMMEDIATELY. If it is mild, please call our office. . If you take any of these medications: Glipizide/Metformin, Avandament, Glucavance, please do not take 48 hours after completing test unless otherwise instructed.   Once we have confirmed authorization from your insurance company, we will call you to set up a date and time for your test. Based on how quickly your insurance processes prior authorizations requests, please allow up to 4 weeks to be contacted for scheduling your Cardiac CT appointment. Be advised that routine Cardiac CT appointments could be scheduled as many as 8 weeks after your provider has ordered it.  For non-scheduling related questions, please contact the cardiac imaging nurse navigator should you have any questions/concerns: Marchia Bond, Cardiac Imaging Nurse Navigator Burley Saver, Interim Cardiac Imaging Nurse [place] [facility] Direct Office Dial: [phone]   For scheduling needs, including cancellations and rescheduling, please call [place], [phone].     Follow-Up: At [facility], you and your health needs are our priority.  As part of our continuing mission to provide you with exceptional heart care, we have created designated Provider Care Teams.  These Care Teams include your primary Cardiologist (physician) and Advanced  Practice Providers (APPs -  Physician Assistants and Nurse Practitioners) who all work together to provide you with the care you need, when you need it.  We recommend signing up for the patient portal called "MyChart".  Sign up information is provided on this After Visit Summary.  MyChart is used to connect with patients for Virtual Visits (Telemedicine).  Patients are able to view lab/test results, encounter notes, upcoming appointments, etc.  Non-urgent  messages can be sent to your provider as well.   To learn more about what you can do with MyChart, go to [email or web address].    Your next appointment:   3 month(s)  The format for your next appointment:   In Person  Provider:   Berniece Salines, DO   Other Instructions   Echocardiogram An echocardiogram is a procedure that uses painless sound waves (ultrasound) to produce an image of the heart. Images from an echocardiogram can provide important information about:  Signs of coronary artery disease (CAD).  Aneurysm detection. An aneurysm is a weak or damaged part of an artery wall that bulges out from the normal force of blood pumping through the body.  Heart size and shape. Changes in the size or shape of the heart can be associated with certain conditions, including heart failure, aneurysm, and CAD.  Heart muscle function.  Heart valve function.  Signs of a past heart attack.  Fluid buildup around the heart.  Thickening of the heart muscle.  A tumor or infectious growth around the heart valves. Tell a health care provider about:  Any allergies you have.  All medicines you are taking, including vitamins, herbs, eye drops, creams, and over-the-counter medicines.  Any blood disorders you have.  Any surgeries you have had.  Any medical conditions you have.  Whether you are pregnant or may be pregnant. What are the risks? Generally, this is a safe procedure. However, problems may occur, including:  Allergic reaction to dye (contrast) that may be used during the procedure. What happens before the procedure? No specific preparation is needed. You may eat and drink normally. What happens during the procedure?   An IV tube may be inserted into one of your veins.  You may receive contrast through this tube. A contrast is an injection that improves the quality of the pictures from your heart.  A gel will be applied to your chest.  A wand-like tool (transducer)  will be moved over your chest. The gel will help to transmit the sound waves from the transducer.  The sound waves will harmlessly bounce off of your heart to allow the heart images to be captured in real-time motion. The images will be recorded on a computer. The procedure may vary among health care providers and hospitals. What happens after the procedure?  You may return to your normal, everyday life, including diet, activities, and medicines, unless your health care provider tells you not to do that. Summary  An echocardiogram is a procedure that uses painless sound waves (ultrasound) to produce an image of the heart.  Images from an echocardiogram can provide important information about the size and shape of your heart, heart muscle function, heart valve function, and fluid buildup around your heart.  You do not need to do anything to prepare before this procedure. You may eat and drink normally.  After the echocardiogram is completed, you may return to your normal, everyday life, unless your health care provider tells you not to do that. This information is not intended to  replace advice given to you by your health care provider. Make sure you discuss any questions you have with your health care provider. Document Revised: 08/20/2018 Document Reviewed: 06/01/2016 Elsevier Patient Education  [place].   Sleep Studies A sleep study (polysomnogram) is a series of tests done while you are sleeping. A sleep study records your brain waves, heart rate, breathing rate, oxygen level, and eye and leg movements. A sleep study helps your health care provider:  See how well you sleep.  Diagnose a sleep disorder.  Determine how severe your sleep disorder is.  Create a plan to treat your sleep disorder. Your health care provider may recommend a sleep study if you:  Feel sleepy on most days.  Snore loudly while sleeping.  Have unusual behaviors while you sleep, such as  walking.  Have brief periods in which you stop breathing during sleep (sleepapnea).  Fall asleep suddenly during the day (narcolepsy).  Have trouble falling asleep or staying asleep (insomnia).  Feel like you need to move your legs when trying to fall asleep (restless legs syndrome).  Move your legs by flexing and extending them regularly while asleep (periodic limb movement disorder).  Act out your dreams while you sleep (sleep behavior disorder).  Feel like you cannot move when you first wake up (sleep paralysis). What tests are part of a sleep study? Most sleep studies record the following during sleep:  Brain activity.  Eye movements.  Heart rate and rhythm.  Breathing rate and rhythm.  Blood-oxygen level.  Blood pressure.  Chest and belly movement as you breathe.  Arm and leg movements.  Snoring or other noises.  Body position. Where are sleep studies done? Sleep studies are done at sleep centers. A sleep center may be inside a hospital, office, or clinic. The room where you have the study may look like a hospital room or a hotel room. The health care providers doing the study may come in and out of the room during the study. Most of the time, they will be in another room monitoring your test as you sleep. How are sleep studies done? Most sleep studies are done during a normal period of time for a full night of sleep. You will arrive at the study center in the evening and go home in the morning. Before the test  Bring your pajamas and toothbrush with you to the sleep study.  Do not have caffeine on the day of your sleep study.  Do not drink alcohol on the day of your sleep study.  Your health care provider will let you know if you should stop taking any of your regular medicines before the test. During the test      Round, sticky patches with sensors attached to recording wires (electrodes) are placed on your scalp, face, chest, and limbs.  Wires from  all the electrodes and sensors run from your bed to a computer. The wires can be taken off and put back on if you need to get out of bed to go to the bathroom.  A sensor is placed over your nose to measure airflow.  A finger clip is put on your finger or ear to measure your blood oxygen level (pulse oximetry).  A belt is placed around your belly and a belt is placed around your chest to measure breathing movements.  If you have signs of the sleep disorder called sleep apnea during your test, you may get a treatment mask to wear for the  second half of the night. ? The mask provides positive airway pressure (PAP) to help you breathe better during sleep. This may greatly improve your sleep apnea. ? You will then have all tests done again with the mask in place to see if your measurements and recordings change. After the test  A medical doctor who specializes in sleep will evaluate the results of your sleep study and share them with you and your primary health care provider.  Based on your results, your medical history, and a physical exam, you may be diagnosed with a sleep disorder, such as: ? Sleep apnea. ? Restless legs syndrome. ? Sleep-related behavior disorder. ? Sleep-related movement disorders. ? Sleep-related seizure disorders.  Your health care team will help determine your treatment options based on your diagnosis. This may include: ? Improving your sleep habits (sleep hygiene). ? Wearing a continuous positive airway pressure (CPAP) or bi-level positive airway pressure (BPAP) mask. ? Wearing an oral device at night to improve breathing and reduce snoring. ? Taking medicines. Follow these instructions at home:  Take over-the-counter and prescription medicines only as told by your health care provider.  If you are instructed to use a CPAP or BPAP mask, make sure you use it nightly as directed.  Make any lifestyle changes that your health care provider recommends.  If you were  given a device to open your airway while you sleep, use it only as told by your health care provider.  Do not use any tobacco products, such as cigarettes, chewing tobacco, and e-cigarettes. If you need help quitting, ask your health care provider.  Keep all follow-up visits as told by your health care provider. This is important. Summary  A sleep study (polysomnogram) is a series of tests done while you are sleeping. It shows how well you sleep.  Most sleep studies are done over one full night of sleep. You will arrive at the study center in the evening and go home in the morning.  If you have signs of the sleep disorder called sleep apnea during your test, you may get a treatment mask to wear for the second half of the night.  A medical doctor who specializes in sleep will evaluate the results of your sleep study and share them with your primary health care provider. This information is not intended to replace advice given to you by your health care provider. Make sure you discuss any questions you have with your health care provider. Document Revised: 10/14/2018 Document Reviewed: 05/27/2017 Elsevier Patient Education  [place].   Cardiac CT Angiogram A cardiac CT angiogram is a procedure to look at the heart and the area around the heart. It may be done to help find the cause of chest pains or other symptoms of heart disease. During this procedure, a substance called contrast dye is injected into the blood vessels in the area to be checked. A large X-ray machine, called a CT scanner, then takes detailed pictures of the heart and the surrounding area. The procedure is also sometimes called a coronary CT angiogram, coronary artery scanning, or CTA. A cardiac CT angiogram allows the health care provider to see how well blood is flowing to and from the heart. The health care provider will be able to see if there are any problems, such as:  Blockage or narrowing of the coronary  arteries in the heart.  Fluid around the heart.  Signs of weakness or disease in the muscles, valves, and tissues of the  heart. Tell a health care provider about:  Any allergies you have. This is especially important if you have had a previous allergic reaction to contrast dye.  All medicines you are taking, including vitamins, herbs, eye drops, creams, and over-the-counter medicines.  Any blood disorders you have.  Any surgeries you have had.  Any medical conditions you have.  Whether you are pregnant or may be pregnant.  Any anxiety disorders, chronic pain, or other conditions you have that may increase your stress or prevent you from lying still. What are the risks? Generally, this is a safe procedure. However, problems may occur, including:  Bleeding.  Infection.  Allergic reactions to medicines or dyes.  Damage to other structures or organs.  Kidney damage from the contrast dye that is used.  Increased risk of cancer from radiation exposure. This risk is low. Talk with your health care provider about: ? The risks and benefits of testing. ? How you can receive the lowest dose of radiation. What happens before the procedure?  Wear comfortable clothing and remove any jewelry, glasses, dentures, and hearing aids.  Follow instructions from your health care provider about eating and drinking. This may include: ? For 12 hours before the procedure -- avoid caffeine. This includes tea, coffee, soda, energy Rivet, and diet pills. Drink plenty of water or other fluids that do not have caffeine in them. Being well hydrated can prevent complications. ? For 4-6 hours before the procedure -- stop eating and drinking. The contrast dye can cause nausea, but this is less likely if your stomach is empty.  Ask your health care provider about changing or stopping your regular medicines. This is especially important if you are taking diabetes medicines, blood thinners, or medicines to  treat problems with erections (erectile dysfunction). What happens during the procedure?   Hair on your chest may need to be removed so that small sticky patches called electrodes can be placed on your chest. These will transmit information that helps to monitor your heart during the procedure.  An IV will be inserted into one of your veins.  You might be given a medicine to control your heart rate during the procedure. This will help to ensure that good images are obtained.  You will be asked to lie on an exam table. This table will slide in and out of the CT machine during the procedure.  Contrast dye will be injected into the IV. You might feel warm, or you may get a metallic taste in your mouth.  You will be given a medicine called nitroglycerin. This will relax or dilate the arteries in your heart.  The table that you are lying on will move into the CT machine tunnel for the scan.  The person running the machine will give you instructions while the scans are being done. You may be asked to: ? Keep your arms above your head. ? Hold your breath. ? Stay very still, even if the table is moving.  When the scanning is complete, you will be moved out of the machine.  The IV will be removed. The procedure may vary among health care providers and hospitals. What can I expect after the procedure? After your procedure, it is common to have:  A metallic taste in your mouth from the contrast dye.  A feeling of warmth.  A headache from the nitroglycerin. Follow these instructions at home:  Take over-the-counter and prescription medicines only as told by your health care provider.  If you  are told, drink enough fluid to keep your urine pale yellow. This will help to flush the contrast dye out of your body.  Most people can return to their normal activities right after the procedure. Ask your health care provider what activities are safe for you.  It is up to you to get the results of  your procedure. Ask your health care provider, or the department that is doing the procedure, when your results will be ready.  Keep all follow-up visits as told by your health care provider. This is important. Contact a health care provider if:  You have any symptoms of allergy to the contrast dye. These include: ? Shortness of breath. ? Rash or hives. ? A racing heartbeat. Summary  A cardiac CT angiogram is a procedure to look at the heart and the area around the heart. It may be done to help find the cause of chest pains or other symptoms of heart disease.  During this procedure, a large X-ray machine, called a CT scanner, takes detailed pictures of the heart and the surrounding area after a contrast dye has been injected into blood vessels in the area.  Ask your health care provider about changing or stopping your regular medicines before the procedure. This is especially important if you are taking diabetes medicines, blood thinners, or medicines to treat erectile dysfunction.  If you are told, drink enough fluid to keep your urine pale yellow. This will help to flush the contrast dye out of your body. This information is not intended to replace advice given to you by your health care provider. Make sure you discuss any questions you have with your health care provider. Document Revised: 12/23/2018 Document Reviewed: 12/23/2018 Elsevier Patient Education  [place].     Adopting a Healthy Lifestyle.  Know what a healthy weight is for you (roughly BMI <25) and aim to maintain this   Aim for 7+ servings of fruits and vegetables daily   65-80+ fluid ounces of water or unsweet tea for healthy kidneys   Limit to max 1 drink of alcohol per day; avoid smoking/tobacco   Limit animal fats in diet for cholesterol and heart health - choose grass fed whenever available   Avoid highly processed foods, and foods high in saturated/trans fats   Aim for low stress - take time to  unwind and care for your mental health   Aim for 150 min of moderate intensity exercise weekly for heart health, and weights twice weekly for bone health   Aim for 7-9 hours of sleep daily   When it comes to diets, agreement about the perfect plan isnt easy to find, even among the experts. Experts at the [place] developed an idea known as the Healthy Eating Plate. Just imagine a plate divided into logical, healthy portions.   The emphasis is on diet quality:   Load up on vegetables and fruits - one-half of your plate: Aim for color and variety, and remember that potatoes dont count.   Go for whole grains - one-quarter of your plate: Whole wheat, barley, wheat berries, quinoa, oats, brown rice, and foods made with them. If you want pasta, go with whole wheat pasta.   Protein power - one-quarter of your plate: Fish, chicken, beans, and nuts are all healthy, versatile protein sources. Limit red meat.   The diet, however, does go beyond the plate, offering a few other suggestions.   Use healthy plant oils, such as olive,  canola, soy, corn, sunflower and peanut. Check the labels, and avoid partially hydrogenated oil, which have unhealthy trans fats.   If youre thirsty, drink water. Coffee and tea are good in moderation, but skip sugary [name] and limit milk and dairy products to one or two daily servings.   The type of carbohydrate in the diet is more important than the amount. Some sources of carbohydrates, such as vegetables, fruits, whole grains, and beans-are healthier than others.   Finally, stay active  Signed, Berniece Salines, DO  04/13/2020 10:50 AM    [place]

## 2020-04-13 NOTE — Patient Instructions (Signed)
Medication Instructions:  Your physician recommends that you continue on your current medications as directed. Please refer to the Current Medication list given to you today.  *If you need a refill on your cardiac medications before your next appointment, please call your pharmacy*   Lab Work: Your physician recommends that you return for lab work today: vitamin d   Your physician recommends that you return for lab work: 3-7 days before ct : bmp   If you have labs (blood work) drawn today and your tests are completely normal, you will receive your results only by: Marland Kitchen MyChart Message (if you have MyChart) OR . A paper copy in the mail If you have any lab test that is abnormal or we need to change your treatment, we will call you to review the results.   Testing/Procedures: Your physician has requested that you have an echocardiogram. Echocardiography is a painless test that uses sound waves to create images of your heart. It provides your doctor with information about the size and shape of your heart and how well your heart's chambers and valves are working. This procedure takes approximately one hour. There are no restrictions for this procedure.  A zio monitor was ordered today. It will remain on for 7 days. You will then return monitor and event diary in provided box. It takes 1-2 weeks for report to be downloaded and returned to [place]. We will call you with the results. If monitor falls off or has orange flashing light, please call Zio for further instructions.   Your physician has recommended that you have a sleep study. This test records several body functions during sleep, including: brain activity, eye movement, oxygen and carbon dioxide blood levels, heart rate and rhythm, breathing rate and rhythm, the flow of air through your mouth and nose, snoring, body muscle movements, and chest and belly movement.  Your cardiac CT will be scheduled at one of the below locations:   [facility] [address] [place], [place] [address] [phone]  [place] [address] [place], [place] [address] [phone]  If scheduled at [facility], please arrive at the [facility] main entrance of [facility] 30 minutes prior to test start time. Proceed to the [facility] Radiology Department (first floor) to check-in and test prep.  If scheduled at [facility], please arrive 15 mins early for check-in and test prep.  Please follow these instructions carefully (unless otherwise directed):  Hold all erectile dysfunction medications at least 3 days (72 hrs) prior to test.  On the Night Before the Test: . Be sure to Drink plenty of water. . Do not consume any caffeinated/decaffeinated beverages or chocolate 12 hours prior to your test. . Do not take any antihistamines 12 hours prior to your test.  On the Day of the Test: . Drink plenty of water. Do not drink any water within one hour of the test. . Do not eat any food 4 hours prior to the test. . You may take your regular medications prior to the test.  . Take metoprolol (Lopressor) two hours prior to test.          After the Test: . Drink plenty of water. . After receiving IV contrast, you may experience a mild flushed feeling. This is normal. . On occasion, you may experience a mild rash up to 24 hours after the test. This is not dangerous. If this  occurs, you can take Benadryl 25 mg and increase your fluid intake. . If you experience trouble breathing, this can be serious. If it is severe call 911 IMMEDIATELY. If it is mild, please call our office. . If you take any of these medications: Glipizide/Metformin, Avandament, Glucavance, please do not take 48 hours after completing test unless otherwise instructed.   Once we have confirmed authorization from your insurance company, we will call you to set up a date and  time for your test. Based on how quickly your insurance processes prior authorizations requests, please allow up to 4 weeks to be contacted for scheduling your Cardiac CT appointment. Be advised that routine Cardiac CT appointments could be scheduled as many as 8 weeks after your provider has ordered it.  For non-scheduling related questions, please contact the cardiac imaging nurse navigator should you have any questions/concerns: Marchia Bond, Cardiac Imaging Nurse Navigator Burley Saver, Interim Cardiac Imaging Nurse [facility] [facility] Direct Office Dial: [phone]   For scheduling needs, including cancellations and rescheduling, please call [place], [phone].     Follow-Up: At [facility], you and your health needs are our priority.  As part of our continuing mission to provide you with exceptional heart care, we have created designated Provider Care Teams.  These Care Teams include your primary Cardiologist (physician) and Advanced Practice Providers (APPs -  Physician Assistants and Nurse Practitioners) who all work together to provide you with the care you need, when you need it.  We recommend signing up for the patient portal called "MyChart".  Sign up information is provided on this After Visit Summary.  MyChart is used to connect with patients for Virtual Visits (Telemedicine).  Patients are able to view lab/test results, encounter notes, upcoming appointments, etc.  Non-urgent messages can be sent to your provider as well.   To learn more about what you can do with MyChart, go to [email or web address].    Your next appointment:   3 month(s)  The format for your next appointment:   In Person  Provider:   Berniece Salines, DO   Other Instructions   Echocardiogram An echocardiogram is a procedure that uses painless sound waves (ultrasound) to produce an image of the heart. Images from an echocardiogram can provide important information  about:  Signs of coronary artery disease (CAD).  Aneurysm detection. An aneurysm is a weak or damaged part of an artery wall that bulges out from the normal force of blood pumping through the body.  Heart size and shape. Changes in the size or shape of the heart can be associated with certain conditions, including heart failure, aneurysm, and CAD.  Heart muscle function.  Heart valve function.  Signs of a past heart attack.  Fluid buildup around the heart.  Thickening of the heart muscle.  A tumor or infectious growth around the heart valves. Tell a health care provider about:  Any allergies you have.  All medicines you are taking, including vitamins, herbs, eye drops, creams, and over-the-counter medicines.  Any blood disorders you have.  Any surgeries you have had.  Any medical conditions you have.  Whether you are pregnant or may be pregnant. What are the risks? Generally, this is a safe procedure. However, problems may occur, including:  Allergic reaction to dye (contrast) that may be used during the procedure. What happens before the procedure? No specific preparation is needed. You may eat and drink normally. What happens during the procedure?   An IV tube  may be inserted into one of your veins.  You may receive contrast through this tube. A contrast is an injection that improves the quality of the pictures from your heart.  A gel will be applied to your chest.  A wand-like tool (transducer) will be moved over your chest. The gel will help to transmit the sound waves from the transducer.  The sound waves will harmlessly bounce off of your heart to allow the heart images to be captured in real-time motion. The images will be recorded on a computer. The procedure may vary among health care providers and hospitals. What happens after the procedure?  You may return to your normal, everyday life, including diet, activities, and medicines, unless your health care  provider tells you not to do that. Summary  An echocardiogram is a procedure that uses painless sound waves (ultrasound) to produce an image of the heart.  Images from an echocardiogram can provide important information about the size and shape of your heart, heart muscle function, heart valve function, and fluid buildup around your heart.  You do not need to do anything to prepare before this procedure. You may eat and drink normally.  After the echocardiogram is completed, you may return to your normal, everyday life, unless your health care provider tells you not to do that. This information is not intended to replace advice given to you by your health care provider. Make sure you discuss any questions you have with your health care provider. Document Revised: 08/20/2018 Document Reviewed: 06/01/2016 Elsevier Patient Education  [place].   Sleep Studies A sleep study (polysomnogram) is a series of tests done while you are sleeping. A sleep study records your brain waves, heart rate, breathing rate, oxygen level, and eye and leg movements. A sleep study helps your health care provider:  See how well you sleep.  Diagnose a sleep disorder.  Determine how severe your sleep disorder is.  Create a plan to treat your sleep disorder. Your health care provider may recommend a sleep study if you:  Feel sleepy on most days.  Snore loudly while sleeping.  Have unusual behaviors while you sleep, such as walking.  Have brief periods in which you stop breathing during sleep (sleepapnea).  Fall asleep suddenly during the day (narcolepsy).  Have trouble falling asleep or staying asleep (insomnia).  Feel like you need to move your legs when trying to fall asleep (restless legs syndrome).  Move your legs by flexing and extending them regularly while asleep (periodic limb movement disorder).  Act out your dreams while you sleep (sleep behavior disorder).  Feel like you cannot  move when you first wake up (sleep paralysis). What tests are part of a sleep study? Most sleep studies record the following during sleep:  Brain activity.  Eye movements.  Heart rate and rhythm.  Breathing rate and rhythm.  Blood-oxygen level.  Blood pressure.  Chest and belly movement as you breathe.  Arm and leg movements.  Snoring or other noises.  Body position. Where are sleep studies done? Sleep studies are done at sleep centers. A sleep center may be inside a hospital, office, or clinic. The room where you have the study may look like a hospital room or a hotel room. The health care providers doing the study may come in and out of the room during the study. Most of the time, they will be in another room monitoring your test as you sleep. How are sleep studies done? Most sleep  studies are done during a normal period of time for a full night of sleep. You will arrive at the study center in the evening and go home in the morning. Before the test  Bring your pajamas and toothbrush with you to the sleep study.  Do not have caffeine on the day of your sleep study.  Do not drink alcohol on the day of your sleep study.  Your health care provider will let you know if you should stop taking any of your regular medicines before the test. During the test      Round, sticky patches with sensors attached to recording wires (electrodes) are placed on your scalp, face, chest, and limbs.  Wires from all the electrodes and sensors run from your bed to a computer. The wires can be taken off and put back on if you need to get out of bed to go to the bathroom.  A sensor is placed over your nose to measure airflow.  A finger clip is put on your finger or ear to measure your blood oxygen level (pulse oximetry).  A belt is placed around your belly and a belt is placed around your chest to measure breathing movements.  If you have signs of the sleep disorder called sleep apnea  during your test, you may get a treatment mask to wear for the second half of the night. ? The mask provides positive airway pressure (PAP) to help you breathe better during sleep. This may greatly improve your sleep apnea. ? You will then have all tests done again with the mask in place to see if your measurements and recordings change. After the test  A medical doctor who specializes in sleep will evaluate the results of your sleep study and share them with you and your primary health care provider.  Based on your results, your medical history, and a physical exam, you may be diagnosed with a sleep disorder, such as: ? Sleep apnea. ? Restless legs syndrome. ? Sleep-related behavior disorder. ? Sleep-related movement disorders. ? Sleep-related seizure disorders.  Your health care team will help determine your treatment options based on your diagnosis. This may include: ? Improving your sleep habits (sleep hygiene). ? Wearing a continuous positive airway pressure (CPAP) or bi-level positive airway pressure (BPAP) mask. ? Wearing an oral device at night to improve breathing and reduce snoring. ? Taking medicines. Follow these instructions at home:  Take over-the-counter and prescription medicines only as told by your health care provider.  If you are instructed to use a CPAP or BPAP mask, make sure you use it nightly as directed.  Make any lifestyle changes that your health care provider recommends.  If you were given a device to open your airway while you sleep, use it only as told by your health care provider.  Do not use any tobacco products, such as cigarettes, chewing tobacco, and e-cigarettes. If you need help quitting, ask your health care provider.  Keep all follow-up visits as told by your health care provider. This is important. Summary  A sleep study (polysomnogram) is a series of tests done while you are sleeping. It shows how well you sleep.  Most sleep studies are  done over one full night of sleep. You will arrive at the study center in the evening and go home in the morning.  If you have signs of the sleep disorder called sleep apnea during your test, you may get a treatment mask to wear for the second half  of the night.  A medical doctor who specializes in sleep will evaluate the results of your sleep study and share them with your primary health care provider. This information is not intended to replace advice given to you by your health care provider. Make sure you discuss any questions you have with your health care provider. Document Revised: 10/14/2018 Document Reviewed: 05/27/2017 Elsevier Patient Education  [place].   Cardiac CT Angiogram A cardiac CT angiogram is a procedure to look at the heart and the area around the heart. It may be done to help find the cause of chest pains or other symptoms of heart disease. During this procedure, a substance called contrast dye is injected into the blood vessels in the area to be checked. A large X-ray machine, called a CT scanner, then takes detailed pictures of the heart and the surrounding area. The procedure is also sometimes called a coronary CT angiogram, coronary artery scanning, or CTA. A cardiac CT angiogram allows the health care provider to see how well blood is flowing to and from the heart. The health care provider will be able to see if there are any problems, such as:  Blockage or narrowing of the coronary arteries in the heart.  Fluid around the heart.  Signs of weakness or disease in the muscles, valves, and tissues of the heart. Tell a health care provider about:  Any allergies you have. This is especially important if you have had a previous allergic reaction to contrast dye.  All medicines you are taking, including vitamins, herbs, eye drops, creams, and over-the-counter medicines.  Any blood disorders you have.  Any surgeries you have had.  Any medical conditions you  have.  Whether you are pregnant or may be pregnant.  Any anxiety disorders, chronic pain, or other conditions you have that may increase your stress or prevent you from lying still. What are the risks? Generally, this is a safe procedure. However, problems may occur, including:  Bleeding.  Infection.  Allergic reactions to medicines or dyes.  Damage to other structures or organs.  Kidney damage from the contrast dye that is used.  Increased risk of cancer from radiation exposure. This risk is low. Talk with your health care provider about: ? The risks and benefits of testing. ? How you can receive the lowest dose of radiation. What happens before the procedure?  Wear comfortable clothing and remove any jewelry, glasses, dentures, and hearing aids.  Follow instructions from your health care provider about eating and drinking. This may include: ? For 12 hours before the procedure -- avoid caffeine. This includes tea, coffee, soda, energy All, and diet pills. Drink plenty of water or other fluids that do not have caffeine in them. Being well hydrated can prevent complications. ? For 4-6 hours before the procedure -- stop eating and drinking. The contrast dye can cause nausea, but this is less likely if your stomach is empty.  Ask your health care provider about changing or stopping your regular medicines. This is especially important if you are taking diabetes medicines, blood thinners, or medicines to treat problems with erections (erectile dysfunction). What happens during the procedure?   Hair on your chest may need to be removed so that small sticky patches called electrodes can be placed on your chest. These will transmit information that helps to monitor your heart during the procedure.  An IV will be inserted into one of your veins.  You might be given a medicine to control  your heart rate during the procedure. This will help to ensure that good images are obtained.  You  will be asked to lie on an exam table. This table will slide in and out of the CT machine during the procedure.  Contrast dye will be injected into the IV. You might feel warm, or you may get a metallic taste in your mouth.  You will be given a medicine called nitroglycerin. This will relax or dilate the arteries in your heart.  The table that you are lying on will move into the CT machine tunnel for the scan.  The person running the machine will give you instructions while the scans are being done. You may be asked to: ? Keep your arms above your head. ? Hold your breath. ? Stay very still, even if the table is moving.  When the scanning is complete, you will be moved out of the machine.  The IV will be removed. The procedure may vary among health care providers and hospitals. What can I expect after the procedure? After your procedure, it is common to have:  A metallic taste in your mouth from the contrast dye.  A feeling of warmth.  A headache from the nitroglycerin. Follow these instructions at home:  Take over-the-counter and prescription medicines only as told by your health care provider.  If you are told, drink enough fluid to keep your urine pale yellow. This will help to flush the contrast dye out of your body.  Most people can return to their normal activities right after the procedure. Ask your health care provider what activities are safe for you.  It is up to you to get the results of your procedure. Ask your health care provider, or the department that is doing the procedure, when your results will be ready.  Keep all follow-up visits as told by your health care provider. This is important. Contact a health care provider if:  You have any symptoms of allergy to the contrast dye. These include: ? Shortness of breath. ? Rash or hives. ? A racing heartbeat. Summary  A cardiac CT angiogram is a procedure to look at the heart and the area around the heart. It may  be done to help find the cause of chest pains or other symptoms of heart disease.  During this procedure, a large X-ray machine, called a CT scanner, takes detailed pictures of the heart and the surrounding area after a contrast dye has been injected into blood vessels in the area.  Ask your health care provider about changing or stopping your regular medicines before the procedure. This is especially important if you are taking diabetes medicines, blood thinners, or medicines to treat erectile dysfunction.  If you are told, drink enough fluid to keep your urine pale yellow. This will help to flush the contrast dye out of your body. This information is not intended to replace advice given to you by your health care provider. Make sure you discuss any questions you have with your health care provider. Document Revised: 12/23/2018 Document Reviewed: 12/23/2018 Elsevier Patient Education  [place].

## 2020-04-13 NOTE — Addendum Note (Signed)
Addended by: Birder Robson on: 04/13/2020 01:02 PM   Modules accepted: Orders

## 2020-04-17 ENCOUNTER — Ambulatory Visit (INDEPENDENT_AMBULATORY_CARE_PROVIDER_SITE_OTHER): Payer: Medicare Other

## 2020-04-17 ENCOUNTER — Encounter: Payer: Self-pay | Admitting: Podiatry

## 2020-04-17 ENCOUNTER — Other Ambulatory Visit: Payer: Self-pay

## 2020-04-17 ENCOUNTER — Ambulatory Visit: Payer: Medicare Other | Admitting: Podiatry

## 2020-04-17 DIAGNOSIS — M7732 Calcaneal spur, left foot: Secondary | ICD-10-CM | POA: Diagnosis not present

## 2020-04-17 DIAGNOSIS — M79672 Pain in left foot: Secondary | ICD-10-CM

## 2020-04-17 DIAGNOSIS — M216X9 Other acquired deformities of unspecified foot: Secondary | ICD-10-CM

## 2020-04-17 DIAGNOSIS — M722 Plantar fascial fibromatosis: Secondary | ICD-10-CM

## 2020-04-17 MED ORDER — TRIAMCINOLONE ACETONIDE 10 MG/ML IJ SUSP
5.0000 mg | Freq: Once | INTRAMUSCULAR | Status: AC
  Administered 2020-04-17: 5 mg via INTRA_ARTICULAR

## 2020-04-17 MED ORDER — DEXAMETHASONE SODIUM PHOSPHATE 120 MG/30ML IJ SOLN
4.0000 mg | Freq: Once | INTRAMUSCULAR | Status: AC
  Administered 2020-04-17: 4 mg via INTRA_ARTICULAR

## 2020-04-17 NOTE — Patient Instructions (Signed)

## 2020-04-17 NOTE — Progress Notes (Signed)
  Subjective:  Patient ID: Todd Rose, male    DOB: March 22, 1976,  MRN: [id number]  Chief Complaint  Patient presents with  . Plantar Fasciitis    the left heel is hurting and i can not walk    44 y.o. male presents with the above complaint. History confirmed with patient.   Objective:  Physical Exam: warm, good capillary refill, no trophic changes or ulcerative lesions, normal DP and PT pulses and normal sensory exam. Left Foot: tenderness to palpation medial calcaneal tuber, no pain with calcaneal squeeze, decreased ankle joint ROM and +Silverskiold test  Radiographs: X-ray of the left foot: no evidence of calcaneal stress fracture and plantar calcaneal spur Assessment:   1. Plantar fasciitis of left foot   2. Equinus deformity of foot   3. Calcaneal spur of left foot    Plan:  Patient was evaluated and treated and all questions answered.  Plantar Fasciitis -XR reviewed with patient -Educated patient on stretching and icing of the affected limb -Night splint dispensed -Injection delivered to the plantar fascia of the left foot.  Procedure: Injection Tendon/Ligament Consent: Verbal consent obtained. Location: Left plantar fascia at the glabrous junction; medial approach. Skin Prep: Alcohol. Injectate: 1 cc 0.5% marcaine plain, 1 cc dexamethasone phosphate, 0.5 cc kenalog 10. Disposition: Patient tolerated procedure well. Injection site dressed with a band-aid.  Return if symptoms worsen or fail to improve.

## 2020-04-18 ENCOUNTER — Telehealth: Payer: Self-pay | Admitting: Cardiology

## 2020-04-18 DIAGNOSIS — Z79899 Other long term (current) drug therapy: Secondary | ICD-10-CM | POA: Diagnosis not present

## 2020-04-18 DIAGNOSIS — Z9889 Other specified postprocedural states: Secondary | ICD-10-CM | POA: Diagnosis not present

## 2020-04-18 DIAGNOSIS — G8929 Other chronic pain: Secondary | ICD-10-CM | POA: Diagnosis not present

## 2020-04-18 DIAGNOSIS — Z79891 Long term (current) use of opiate analgesic: Secondary | ICD-10-CM | POA: Diagnosis not present

## 2020-04-18 DIAGNOSIS — M5116 Intervertebral disc disorders with radiculopathy, lumbar region: Secondary | ICD-10-CM | POA: Diagnosis not present

## 2020-04-18 NOTE — Telephone Encounter (Signed)
Spoke to the patient just now and let him know that he can go ahead and mail this monitor back in as he has worn it for 5 days at this time. He verbalizes understanding and thanks me for the call back.

## 2020-04-18 NOTE — Telephone Encounter (Signed)
Pt called in stated that his  Monitor is no sticking anymore.  He stated it must have gotten wet in the shower.  He would like to know what he needs to do ?    Best number  [phone]

## 2020-04-21 LAB — VITAMIN D 1,25 DIHYDROXY
Vitamin D 1, 25 (OH)2 Total: 42 pg/mL
Vitamin D2 1, 25 (OH)2: <10 pg/mL
Vitamin D3 1, 25 (OH)2: 42 pg/mL

## 2020-04-24 ENCOUNTER — Telehealth: Payer: Self-pay

## 2020-04-24 NOTE — Telephone Encounter (Signed)
Spoke with patient regarding results and recommendation.  Patient verbalizes understanding and is agreeable to plan of care. Advised patient to call back with any issues or concerns.  

## 2020-04-24 NOTE — Telephone Encounter (Signed)
-----   Message from Berniece Salines, DO sent at 04/24/2020 11:21 AM EST ----- Lab work normal.

## 2020-04-27 ENCOUNTER — Telehealth (HOSPITAL_COMMUNITY): Payer: Self-pay | Admitting: Emergency Medicine

## 2020-04-27 NOTE — Telephone Encounter (Signed)
Attempted to call patient regarding upcoming cardiac CT appointment. °Left message on voicemail with name and callback number °Brandell Maready RN Navigator Cardiac Imaging °[facility] °[phone] Office °[phone] Cell ° °

## 2020-04-27 NOTE — Telephone Encounter (Signed)
Calling patient to review CCTA instructions. Pt wishes to cancel test for now as he cannot afford the test.  Gave him the dedicated cardiac CT scheduler phone number to r/s at his convenience.  Marchia Bond RN Navigator Cardiac Imaging [facility] [phone] Office  [phone] Cell

## 2020-04-28 ENCOUNTER — Ambulatory Visit (HOSPITAL_COMMUNITY): Payer: Medicare Other

## 2020-04-28 ENCOUNTER — Ambulatory Visit (HOSPITAL_COMMUNITY): Admission: RE | Admit: 2020-04-28 | Payer: Medicare Other | Source: Ambulatory Visit

## 2020-04-28 DIAGNOSIS — R002 Palpitations: Secondary | ICD-10-CM | POA: Diagnosis not present

## 2020-05-09 ENCOUNTER — Telehealth: Payer: Self-pay

## 2020-05-09 NOTE — Telephone Encounter (Signed)
-----   Message from Berniece Salines, DO sent at 05/04/2020  5:17 PM EST ----- Monitor normal please notify patient.

## 2020-05-09 NOTE — Telephone Encounter (Signed)
Left message on patients voicemail to please return our call.   

## 2020-05-09 NOTE — Telephone Encounter (Signed)
Spoke with patient regarding results and recommendation.  Patient verbalizes understanding and is agreeable to plan of care. Advised patient to call back with any issues or concerns.  

## 2020-05-10 ENCOUNTER — Other Ambulatory Visit: Payer: Medicare Other

## 2020-05-18 ENCOUNTER — Ambulatory Visit: Payer: Medicare Other | Admitting: Legal Medicine

## 2020-05-25 ENCOUNTER — Other Ambulatory Visit: Payer: Self-pay

## 2020-05-25 ENCOUNTER — Ambulatory Visit (INDEPENDENT_AMBULATORY_CARE_PROVIDER_SITE_OTHER): Payer: Medicare Other | Admitting: Legal Medicine

## 2020-05-25 ENCOUNTER — Encounter: Payer: Self-pay | Admitting: Podiatry

## 2020-05-25 ENCOUNTER — Encounter: Payer: Self-pay | Admitting: Legal Medicine

## 2020-05-25 ENCOUNTER — Ambulatory Visit: Payer: Medicare Other | Admitting: Podiatry

## 2020-05-25 DIAGNOSIS — M722 Plantar fascial fibromatosis: Secondary | ICD-10-CM | POA: Diagnosis not present

## 2020-05-25 DIAGNOSIS — Z0000 Encounter for general adult medical examination without abnormal findings: Secondary | ICD-10-CM

## 2020-05-25 DIAGNOSIS — J441 Chronic obstructive pulmonary disease with (acute) exacerbation: Secondary | ICD-10-CM | POA: Insufficient documentation

## 2020-05-25 DIAGNOSIS — M216X9 Other acquired deformities of unspecified foot: Secondary | ICD-10-CM

## 2020-05-25 DIAGNOSIS — M7732 Calcaneal spur, left foot: Secondary | ICD-10-CM

## 2020-05-25 MED ORDER — BETAMETHASONE SOD PHOS & ACET 6 (3-3) MG/ML IJ SUSP
6.0000 mg | Freq: Once | INTRAMUSCULAR | Status: AC
  Administered 2020-05-25: 6 mg

## 2020-05-25 NOTE — Progress Notes (Signed)
Subjective:  Patient ID: Todd Rose, male    DOB: [birth year]  Age: 45 y.o. MRN: [id number]  Chief Complaint  Patient presents with  . Annual Exam    AWV    HPI Encounter for general adult medical examination without abnormal findings  Physical ("At Risk" items are starred): Patient's last physical exam was 1 year ago .   Growth percentile SmartLinks can only be used for patients less than 56 years old.   SDOH Screenings   Alcohol Screen: Low Risk   . Last Alcohol Screening Score (AUDIT): 0  Depression (PHQ2-9): Medium Risk  . PHQ-2 Score: 21  Financial Resource Strain: Low Risk   . Difficulty of Paying Living Expenses: Not hard at all  Food Insecurity: No Food Insecurity  . Worried About Charity fundraiser in the Last Year: Never true  . Ran Out of Food in the Last Year: Never true  Housing: Low Risk   . Last Housing Risk Score: 0  Physical Activity: Insufficiently Active  . Days of Exercise per Week: 2 days  . Minutes of Exercise per Session: 30 min  Social Connections: Moderately Isolated  . Frequency of Communication with Friends and Family: Twice a week  . Frequency of Social Gatherings with Friends and Family: Never  . Attends Religious Services: Rose than 4 times per year  . Active Member of Clubs or Organizations: No  . Attends Archivist Meetings: Never  . Marital Status: Married  Stress: Stress Concern Present  . Feeling of Stress : To some extent  Tobacco Use: Medium Risk  . Smoking Tobacco Use: Former Smoker  . Smokeless Tobacco Use: Former Soil scientist Needs: No Transportation Needs  . Lack of Transportation (Medical): No  . Lack of Transportation (Non-Medical): No    Fall Risk  05/25/2020  Falls in the past year? 1  Number falls in past yr: 1  Injury with Fall? 1  Risk for fall due to : Orthopedic patient    Depression screen [facility] 2/9 01/24/2020 08/06/2019 08/05/2019  Decreased Interest 3 3 0  Down, Depressed, Hopeless 3 3 1   PHQ -  2 Score 6 6 1   Altered sleeping 3 3 -  Tired, decreased energy 3 3 -  Change in appetite 3 2 -  Feeling bad or failure about yourself  2 3 -  Trouble concentrating 2 3 -  Moving slowly or fidgety/restless 2 2 -  Suicidal thoughts 0 1 -  PHQ-9 Score 21 23 -  Difficult doing work/chores Extremely dIfficult Extremely dIfficult -    Functional Status Survey: Is the patient deaf or have difficulty hearing?: No Does the patient have difficulty seeing, even when wearing glasses/contacts?: No Does the patient have difficulty concentrating, remembering, or making decisions?: Yes Does the patient have difficulty walking or climbing stairs?: Yes Does the patient have difficulty dressing or bathing?: No Does the patient have difficulty doing errands alone such as visiting a doctor's office or shopping?: No   Safety: reviewed ;  Patient wears a seat belt. Patient's home has smoke detectors and carbon monoxide detectors. Patient practices appropriate gun safety Patient wears sunscreen with extended sun exposure. Dental Care: biannual cleanings, brushes and flosses daily. Ophthalmology/Optometry: Annual visit.  Hearing loss: none Vision impairments: none Patient is not afflicted from Stress Incontinence and Urge Incontinence   Current Outpatient Medications on File Prior to Visit  Medication Sig Dispense Refill  . gabapentin (NEURONTIN) 800 MG tablet Take 1 tablet by mouth  4 times daily (Patient taking differently: Take 800 mg by mouth in the morning, at noon, in the evening, and at bedtime.) 120 tablet 4  . HYDROcodone-acetaminophen (NORCO) 7.5-325 MG tablet Take 1 tablet by mouth every 6 (six) hours as needed for moderate pain.    Marland Kitchen ibuprofen (ADVIL) 200 MG tablet Take 200 mg by mouth every 6 (six) hours as needed for moderate pain.     Marland Kitchen lisinopril (ZESTRIL) 40 MG tablet Take 1 tablet by mouth once daily 90 tablet 2  . metoCLOPramide (REGLAN) 10 MG tablet Take 1 tablet (10 mg total) by mouth  every 8 (eight) hours as needed for nausea (nausea/headache). 10 tablet 0  . omeprazole (PRILOSEC) 40 MG capsule Take 40 mg by mouth 2 (two) times daily.    . pantoprazole (PROTONIX) 40 MG tablet Take 40 mg by mouth daily.    . pravastatin (PRAVACHOL) 40 MG tablet Take 1 tablet (40 mg total) by mouth daily. 90 tablet 2  . metoprolol tartrate (LOPRESSOR) 100 MG tablet Take 1 tablet (100 mg total) by mouth once for 1 dose. 2 hours before ct. 1 tablet 0   No current facility-administered medications on file prior to visit.    Social Hx   Social History   Socioeconomic History  . Marital status: Married    Spouse name: Not on file  . Number of children: Not on file  . Years of education: Not on file  . Highest education level: Not on file  Occupational History  . Not on file  Tobacco Use  . Smoking status: Former Smoker    Packs/day: 0.00    Types: Cigarettes    Quit date: 05/05/2020    Years since quitting: 0.0  . Smokeless tobacco: Former Network engineer  . Vaping Use: Never used  Substance and Sexual Activity  . Alcohol use: Not Currently  . Drug use: Not Currently  . Sexual activity: Yes    Partners: Female  Other Topics Concern  . Not on file  Social History Narrative  . Not on file   Social Determinants of Health   Financial Resource Strain: Low Risk   . Difficulty of Paying Living Expenses: Not hard at all  Food Insecurity: No Food Insecurity  . Worried About Charity fundraiser in the Last Year: Never true  . Ran Out of Food in the Last Year: Never true  Transportation Needs: No Transportation Needs  . Lack of Transportation (Medical): No  . Lack of Transportation (Non-Medical): No  Physical Activity: Insufficiently Active  . Days of Exercise per Week: 2 days  . Minutes of Exercise per Session: 30 min  Stress: Stress Concern Present  . Feeling of Stress : To some extent  Social Connections: Moderately Isolated  . Frequency of Communication with Friends and  Family: Twice a week  . Frequency of Social Gatherings with Friends and Family: Never  . Attends Religious Services: Rose than 4 times per year  . Active Member of Clubs or Organizations: No  . Attends Archivist Meetings: Never  . Marital Status: Married   Past Medical History:  Diagnosis Date  . Back pain   . Benign hypertension 08/05/2019  . Bipolar 1 disorder ([place]) 08/05/2019  . BPH with obstruction/lower urinary tract symptoms 06/21/2019  . Chronic midline low back pain with right-sided sciatica 10/25/2019   Last Assessment & Plan:  Formatting of this note might be different from the original. This is a 45 year old gentleman  with persistent and constant low back pain.  It has been going on for years but got worse over the last 2 years.  It goes down the side of his leg to his foot.  He is on disability for his back.  However he does have 5 children.  Some of those kids he has to lift up and take care   . Cigarette nicotine dependence without complication 0/22/3361  . DDD (degenerative disc disease), lumbar 04/29/2019   Last Assessment & Plan:  Very pleasant 45 year old overweight male with multilevel degenerative changes lumbar spine including degenerative disc disease, neural foraminal stenosis, ligamentum flavum hypertrophy and lumbar radiculopathy.  The patient underwent a right-sided L4-L5 lumbar epidural steroid injection in the clinic today without apparent complication.  He is a candidate for future injec  . Diabetes mellitus without complication ([place])   . Gastroenteritis 06/21/2019  . Hypertension   . Lumbar radiculopathy 04/29/2019   Last Assessment & Plan:  Continue Lyrica 150 mg b.i.d.  . Mixed hyperlipidemia 08/05/2019  . Morbid obesity ([place]) 01/23/2020  . Neural foraminal stenosis of lumbar spine 10/01/2019  . Nonalcoholic steatohepatitis ([facility])   . Pain medication agreement 05/27/2019   Last Assessment & Plan:  Formatting of this note might be different from the  original. UDS completed today.  Review of prior UDS results are within normal limits.  . Primary osteoarthritis of right knee 12/16/2013  . S/P arthroscopy of knee 12/16/2013  . Spinal stenosis of lumbar region with neurogenic claudication 04/29/2019   Last Assessment & Plan:  See degenerative disc plan.  . Type II diabetes mellitus with peripheral circulatory disorder ([place]) 08/05/2019   Family History  Problem Relation Age of Onset  . Cancer Mother     Review of Systems  Constitutional: Negative for activity change and fever.  HENT: Negative for congestion, rhinorrhea and sinus pain.   Eyes: Negative for visual disturbance.  Respiratory: Negative for cough, choking and shortness of breath.   Cardiovascular: Negative for chest pain, palpitations and leg swelling.  Gastrointestinal: Negative for abdominal distention and abdominal pain.  Genitourinary: Negative for difficulty urinating and dysuria.  Musculoskeletal: Positive for arthralgias.  Neurological: Negative for dizziness, weakness and numbness.     Objective:  BP 110/80   Pulse 82   Temp (!) 97.5 F (36.4 C)   Resp 16   Ht 5' 7"  (1.702 m)   Wt 246 lb (111.6 kg)   SpO2 98%   BMI 38.53 kg/m   BP/Weight 05/25/2020 04/13/2020 22/44/9753  Systolic BP 005 110 211  Diastolic BP 80 82 173  Wt. (Lbs) 246 251 253  BMI 38.53 39.31 39.63    Physical Exam reviewed  Lab Results  Component Value Date   WBC 11.2 (H) 04/01/2020   HGB 13.9 04/01/2020   HCT 43.3 04/01/2020   PLT 268 04/01/2020   GLUCOSE 156 (H) 04/01/2020   LDLDIRECT 102 (H) 08/05/2019   ALT 36 04/01/2020   AST 22 04/01/2020   NA 139 04/01/2020   K 3.6 04/01/2020   CL 99 04/01/2020   CREATININE 0.95 04/01/2020   BUN 11 04/01/2020   CO2 31 04/01/2020   INR 1.08 11/09/2017   HGBA1C 7.1 (H) 08/05/2019      Assessment & Plan:  Diagnoses and all orders for this visit: Routine general medical examination at a health care facility Awv performed, living  will discussed, hearing and vision checked   No orders of the defined types were placed in this encounter.  These are the goals we discussed: Goals    . Quit Smoking        This is a list of the screening recommended for you and due dates:  Health Maintenance  Topic Date Due  .  Hepatitis C: One time screening is recommended by Center for Disease Control  (CDC) for  adults born from [birth year] through 1964.   Never done  . Pneumococcal vaccine  Never done  . Complete foot exam   Never done  . Eye exam for diabetics  Never done  . Flu Shot  Never done  . Hemoglobin A1C  02/05/2020  . COVID-19 Vaccine (1) 06/10/2020*  . Tetanus Vaccine  10/02/2028  . HIV Screening  Completed  *Topic was postponed. The date shown is not the original due date.      AN INDIVIDUALIZED CARE PLAN: was established or reinforced today.   SELF MANAGEMENT: The patient and I together assessed ways to personally work towards obtaining the recommended goals  Support needs The patient and/or family needs were assessed and services were offered and not necessary at this time.    Follow-up: No follow-ups on file.  Reinaldo Meeker, MD [facility] [phone]

## 2020-05-25 NOTE — Progress Notes (Signed)
  Subjective:  Patient ID: Todd Rose, male    DOB: 24-Dec-1975,  MRN: [id number]  Chief Complaint  Patient presents with  . Plantar Fasciitis    The left heel is not any better and the shot did not help at all   45 y.o. male presents with the above complaint. History confirmed with patient.   Objective:  Physical Exam: warm, good capillary refill, no trophic changes or ulcerative lesions, normal DP and PT pulses and normal sensory exam. Left Foot: tenderness to palpation medial calcaneal tuber, no pain with calcaneal squeeze, decreased ankle joint ROM and +Silverskiold test  Assessment:   1. Plantar fasciitis of left foot   2. Equinus deformity of foot   3. Calcaneal spur of left foot    Plan:  Patient was evaluated and treated and all questions answered.  Plantar Fasciitis -Repeat injection as below -Dispense plantar fascial brace -Continue stretching and icing.  Procedure: Injection Tendon/Ligament Consent: Verbal consent obtained. Location: Left plantar fascia at the glabrous junction; medial approach. Skin Prep: Alcohol. Injectate: 1 cc 0.5% marcaine plain, 1 cc dexamethasone phosphate, 0.5 cc kenalog 10. Disposition: Patient tolerated procedure well. Injection site dressed with a band-aid.  No follow-ups on file.

## 2020-06-14 ENCOUNTER — Other Ambulatory Visit: Payer: Self-pay

## 2020-06-14 ENCOUNTER — Encounter: Payer: Self-pay | Admitting: Legal Medicine

## 2020-06-19 ENCOUNTER — Encounter: Payer: Self-pay | Admitting: Legal Medicine

## 2020-06-20 DIAGNOSIS — Z20822 Contact with and (suspected) exposure to covid-19: Secondary | ICD-10-CM | POA: Diagnosis not present

## 2020-06-20 DIAGNOSIS — Z03818 Encounter for observation for suspected exposure to other biological agents ruled out: Secondary | ICD-10-CM | POA: Diagnosis not present

## 2020-06-21 ENCOUNTER — Telehealth: Payer: Self-pay

## 2020-06-21 NOTE — Telephone Encounter (Signed)
[facility] to set patient up with an appointment, will fax patient notes to facility per Arbie Cookey, they will need to review to see if they can handle this patient's needs.   [facility] (relationship, Depression/grief or loss, anxiety/stress, trauma / PTSD, Children Flossie Dibble, ADHD, Autism, behavioral problems, substance use, perinatal during pregnancy) [address] [place], [place]  Phone # [phone] Fax # [phone]

## 2020-06-28 DIAGNOSIS — R100 Acute abdomen: Secondary | ICD-10-CM | POA: Diagnosis not present

## 2020-06-28 DIAGNOSIS — R1110 Vomiting, unspecified: Secondary | ICD-10-CM | POA: Diagnosis not present

## 2020-06-28 DIAGNOSIS — R197 Diarrhea, unspecified: Secondary | ICD-10-CM | POA: Diagnosis not present

## 2020-06-28 DIAGNOSIS — R1013 Epigastric pain: Secondary | ICD-10-CM | POA: Diagnosis not present

## 2020-06-28 DIAGNOSIS — E7800 Pure hypercholesterolemia, unspecified: Secondary | ICD-10-CM | POA: Diagnosis not present

## 2020-06-28 DIAGNOSIS — R112 Nausea with vomiting, unspecified: Secondary | ICD-10-CM | POA: Diagnosis not present

## 2020-06-28 DIAGNOSIS — R1030 Lower abdominal pain, unspecified: Secondary | ICD-10-CM | POA: Diagnosis not present

## 2020-07-11 DIAGNOSIS — Z79899 Other long term (current) drug therapy: Secondary | ICD-10-CM | POA: Diagnosis not present

## 2020-07-11 DIAGNOSIS — Z79891 Long term (current) use of opiate analgesic: Secondary | ICD-10-CM | POA: Diagnosis not present

## 2020-07-11 DIAGNOSIS — G8929 Other chronic pain: Secondary | ICD-10-CM | POA: Diagnosis not present

## 2020-07-11 DIAGNOSIS — M5116 Intervertebral disc disorders with radiculopathy, lumbar region: Secondary | ICD-10-CM | POA: Diagnosis not present

## 2020-07-11 DIAGNOSIS — M961 Postlaminectomy syndrome, not elsewhere classified: Secondary | ICD-10-CM | POA: Insufficient documentation

## 2020-07-11 DIAGNOSIS — Z760 Encounter for issue of repeat prescription: Secondary | ICD-10-CM | POA: Diagnosis not present

## 2020-07-12 ENCOUNTER — Other Ambulatory Visit: Payer: Self-pay

## 2020-07-13 ENCOUNTER — Ambulatory Visit: Payer: Medicare Other | Admitting: Cardiology

## 2020-07-17 ENCOUNTER — Other Ambulatory Visit: Payer: Medicare Other

## 2020-07-19 ENCOUNTER — Encounter: Payer: Self-pay | Admitting: Cardiology

## 2020-07-24 ENCOUNTER — Telehealth: Payer: Self-pay

## 2020-07-24 NOTE — Telephone Encounter (Signed)
Spoke with Arbie Cookey to see if the patient has been seen at their facility. She states he has not been. I will refax the notes from the patient's last visit. Arbie Cookey states there is a therapist that accepts his insurance as well as she use to work with [employer]. Arbie Cookey states she knows how to handle grief and depression.

## 2020-08-29 ENCOUNTER — Ambulatory Visit: Payer: Medicare Other | Admitting: Legal Medicine

## 2020-09-04 ENCOUNTER — Telehealth: Payer: Self-pay

## 2020-09-04 DIAGNOSIS — R0789 Other chest pain: Secondary | ICD-10-CM | POA: Diagnosis not present

## 2020-09-04 DIAGNOSIS — R21 Rash and other nonspecific skin eruption: Secondary | ICD-10-CM | POA: Diagnosis not present

## 2020-09-04 DIAGNOSIS — R079 Chest pain, unspecified: Secondary | ICD-10-CM | POA: Diagnosis not present

## 2020-09-04 NOTE — Telephone Encounter (Signed)
Pt calling stating he is having chest pain. Describes pain as burning ache in medial chest between breasts. Complains of radiation towards R arm, headache, off/on dizziness. Pt follows with cardiology and last saw them a few months ago. States he takes lisiniopril once daily at night. States family history on mothers side; his mother went into hospital for bypass and died while ine there and sister died from heart attack.   Advised pt go to hospital with symptoms and family hx. Pt VU and stated he would go to [facility].   Royce Macadamia, [place] 09/04/20 1:48 PM

## 2020-09-09 DIAGNOSIS — E119 Type 2 diabetes mellitus without complications: Secondary | ICD-10-CM | POA: Diagnosis not present

## 2020-09-09 DIAGNOSIS — H40033 Anatomical narrow angle, bilateral: Secondary | ICD-10-CM | POA: Diagnosis not present

## 2020-09-18 DIAGNOSIS — I10 Essential (primary) hypertension: Secondary | ICD-10-CM | POA: Diagnosis not present

## 2020-09-18 DIAGNOSIS — H40013 Open angle with borderline findings, low risk, bilateral: Secondary | ICD-10-CM | POA: Diagnosis not present

## 2020-09-18 DIAGNOSIS — E119 Type 2 diabetes mellitus without complications: Secondary | ICD-10-CM | POA: Diagnosis not present

## 2020-09-18 DIAGNOSIS — H2513 Age-related nuclear cataract, bilateral: Secondary | ICD-10-CM | POA: Diagnosis not present

## 2020-09-30 DIAGNOSIS — S60511A Abrasion of right hand, initial encounter: Secondary | ICD-10-CM | POA: Diagnosis not present

## 2020-09-30 DIAGNOSIS — S01511A Laceration without foreign body of lip, initial encounter: Secondary | ICD-10-CM | POA: Diagnosis not present

## 2020-09-30 DIAGNOSIS — S60519A Abrasion of unspecified hand, initial encounter: Secondary | ICD-10-CM | POA: Diagnosis not present

## 2020-09-30 DIAGNOSIS — Z2914 Encounter for prophylactic rabies immune globin: Secondary | ICD-10-CM | POA: Diagnosis not present

## 2020-09-30 DIAGNOSIS — Z203 Contact with and (suspected) exposure to rabies: Secondary | ICD-10-CM | POA: Diagnosis not present

## 2020-09-30 DIAGNOSIS — S60512A Abrasion of left hand, initial encounter: Secondary | ICD-10-CM | POA: Diagnosis not present

## 2020-09-30 DIAGNOSIS — Z23 Encounter for immunization: Secondary | ICD-10-CM | POA: Diagnosis not present

## 2020-09-30 DIAGNOSIS — S61451A Open bite of right hand, initial encounter: Secondary | ICD-10-CM | POA: Diagnosis not present

## 2020-10-04 DIAGNOSIS — Z2914 Encounter for prophylactic rabies immune globin: Secondary | ICD-10-CM | POA: Diagnosis not present

## 2020-10-04 DIAGNOSIS — Z23 Encounter for immunization: Secondary | ICD-10-CM | POA: Diagnosis not present

## 2020-10-04 DIAGNOSIS — Z203 Contact with and (suspected) exposure to rabies: Secondary | ICD-10-CM | POA: Diagnosis not present

## 2020-10-11 DIAGNOSIS — Z2914 Encounter for prophylactic rabies immune globin: Secondary | ICD-10-CM | POA: Diagnosis not present

## 2020-10-11 DIAGNOSIS — Z203 Contact with and (suspected) exposure to rabies: Secondary | ICD-10-CM | POA: Diagnosis not present

## 2020-10-11 DIAGNOSIS — Z23 Encounter for immunization: Secondary | ICD-10-CM | POA: Diagnosis not present

## 2020-10-13 ENCOUNTER — Other Ambulatory Visit: Payer: Self-pay | Admitting: Legal Medicine

## 2020-10-13 DIAGNOSIS — E782 Mixed hyperlipidemia: Secondary | ICD-10-CM

## 2020-10-18 DIAGNOSIS — Z203 Contact with and (suspected) exposure to rabies: Secondary | ICD-10-CM | POA: Diagnosis not present

## 2020-10-18 DIAGNOSIS — Z23 Encounter for immunization: Secondary | ICD-10-CM | POA: Diagnosis not present

## 2020-10-18 DIAGNOSIS — Z2914 Encounter for prophylactic rabies immune globin: Secondary | ICD-10-CM | POA: Diagnosis not present

## 2020-10-31 DIAGNOSIS — Z79891 Long term (current) use of opiate analgesic: Secondary | ICD-10-CM | POA: Diagnosis not present

## 2020-10-31 DIAGNOSIS — M5116 Intervertebral disc disorders with radiculopathy, lumbar region: Secondary | ICD-10-CM | POA: Diagnosis not present

## 2020-10-31 DIAGNOSIS — G8929 Other chronic pain: Secondary | ICD-10-CM | POA: Diagnosis not present

## 2020-10-31 DIAGNOSIS — Z760 Encounter for issue of repeat prescription: Secondary | ICD-10-CM | POA: Diagnosis not present

## 2020-10-31 DIAGNOSIS — Z79899 Other long term (current) drug therapy: Secondary | ICD-10-CM | POA: Diagnosis not present

## 2020-10-31 DIAGNOSIS — M961 Postlaminectomy syndrome, not elsewhere classified: Secondary | ICD-10-CM | POA: Diagnosis not present

## 2020-11-01 ENCOUNTER — Emergency Department (HOSPITAL_COMMUNITY): Payer: Medicare Other

## 2020-11-01 ENCOUNTER — Other Ambulatory Visit: Payer: Self-pay

## 2020-11-01 ENCOUNTER — Emergency Department (HOSPITAL_COMMUNITY)
Admission: EM | Admit: 2020-11-01 | Discharge: 2020-11-01 | Disposition: A | Discharge status: Home / Self Care | Payer: Medicare Other | Attending: Emergency Medicine | Admitting: Emergency Medicine

## 2020-11-01 ENCOUNTER — Encounter (HOSPITAL_COMMUNITY): Payer: Self-pay | Admitting: *Deleted

## 2020-11-01 DIAGNOSIS — R112 Nausea with vomiting, unspecified: Secondary | ICD-10-CM

## 2020-11-01 DIAGNOSIS — R109 Unspecified abdominal pain: Secondary | ICD-10-CM

## 2020-11-01 DIAGNOSIS — E119 Type 2 diabetes mellitus without complications: Secondary | ICD-10-CM | POA: Diagnosis not present

## 2020-11-01 DIAGNOSIS — R1111 Vomiting without nausea: Secondary | ICD-10-CM | POA: Diagnosis not present

## 2020-11-01 DIAGNOSIS — K760 Fatty (change of) liver, not elsewhere classified: Secondary | ICD-10-CM | POA: Diagnosis not present

## 2020-11-01 DIAGNOSIS — I10 Essential (primary) hypertension: Secondary | ICD-10-CM | POA: Insufficient documentation

## 2020-11-01 DIAGNOSIS — Z79899 Other long term (current) drug therapy: Secondary | ICD-10-CM | POA: Diagnosis not present

## 2020-11-01 DIAGNOSIS — M549 Dorsalgia, unspecified: Secondary | ICD-10-CM | POA: Diagnosis not present

## 2020-11-01 DIAGNOSIS — I700 Atherosclerosis of aorta: Secondary | ICD-10-CM | POA: Diagnosis not present

## 2020-11-01 DIAGNOSIS — Z87891 Personal history of nicotine dependence: Secondary | ICD-10-CM | POA: Insufficient documentation

## 2020-11-01 LAB — COMPREHENSIVE METABOLIC PANEL
ALT: 22 U/L (ref 0–44)
AST: 21 U/L (ref 15–41)
Albumin: 4.2 g/dL (ref 3.5–5.0)
Alkaline Phosphatase: 87 U/L (ref 38–126)
Anion gap: 10 (ref 5–15)
BUN: 12 mg/dL (ref 6–20)
CO2: 28 mmol/L (ref 22–32)
Calcium: 9.6 mg/dL (ref 8.9–10.3)
Chloride: 96 mmol/L — ABNORMAL LOW (ref 98–111)
Creatinine, Ser: 0.82 mg/dL (ref 0.61–1.24)
GFR, Estimated: >60 mL/min (ref >60)
Glucose, Bld: 148 mg/dL — ABNORMAL HIGH (ref 70–99)
Potassium: 3.8 mmol/L (ref 3.5–5.1)
Sodium: 134 mmol/L — ABNORMAL LOW (ref 135–145)
Total Bilirubin: 1.9 mg/dL — ABNORMAL HIGH (ref 0.3–1.2)
Total Protein: 7.2 g/dL (ref 6.5–8.1)

## 2020-11-01 LAB — URINALYSIS, ROUTINE W REFLEX MICROSCOPIC
Bacteria, UA: NONE SEEN
Bilirubin Urine: NEGATIVE
Glucose, UA: NEGATIVE mg/dL
Hgb urine dipstick: NEGATIVE
Ketones, ur: 80 mg/dL — AB
Leukocytes,Ua: NEGATIVE
Nitrite: NEGATIVE
Protein, ur: 30 mg/dL — AB
Specific Gravity, Urine: 1.028 (ref 1.005–1.030)
pH: 7 (ref 5.0–8.0)

## 2020-11-01 LAB — CBC
HCT: 43.3 % (ref 39.0–52.0)
Hemoglobin: 14.7 g/dL (ref 13.0–17.0)
MCH: 29.7 pg (ref 26.0–34.0)
MCHC: 33.9 g/dL (ref 30.0–36.0)
MCV: 87.5 fL (ref 80.0–100.0)
Platelets: 246 10*3/uL (ref 150–400)
RBC: 4.95 MIL/uL (ref 4.22–5.81)
RDW: 12.7 % (ref 11.5–15.5)
WBC: 13.8 10*3/uL — ABNORMAL HIGH (ref 4.0–10.5)
nRBC: 0 % (ref 0.0–0.2)

## 2020-11-01 LAB — LIPASE, BLOOD: Lipase: 22 U/L (ref 11–51)

## 2020-11-01 LAB — CBG MONITORING, ED: Glucose-Capillary: 149 mg/dL — ABNORMAL HIGH (ref 70–99)

## 2020-11-01 MED ORDER — PROCHLORPERAZINE EDISYLATE 10 MG/2ML IJ SOLN
10.0000 mg | Freq: Once | INTRAMUSCULAR | Status: AC
  Administered 2020-11-01: 10 mg via INTRAVENOUS
  Filled 2020-11-01: qty 2

## 2020-11-01 MED ORDER — ONDANSETRON 4 MG PO TBDP
4.0000 mg | ORAL_TABLET | Freq: Once | ORAL | Status: AC | PRN
  Administered 2020-11-01: 4 mg via ORAL
  Filled 2020-11-01: qty 1

## 2020-11-01 MED ORDER — SODIUM CHLORIDE 0.9 % IV BOLUS (SEPSIS)
1000.0000 mL | Freq: Once | INTRAVENOUS | Status: AC
  Administered 2020-11-01: 1000 mL via INTRAVENOUS

## 2020-11-01 MED ORDER — PROCHLORPERAZINE MALEATE 10 MG PO TABS: 10.0000 mg | ORAL_TABLET | Freq: Three times a day (TID) | ORAL | 0 refills | Status: DC | PRN

## 2020-11-01 MED ORDER — MORPHINE SULFATE (PF) 4 MG/ML IV SOLN
4.0000 mg | Freq: Once | INTRAVENOUS | Status: AC
  Administered 2020-11-01: 4 mg via INTRAVENOUS
  Filled 2020-11-01: qty 1

## 2020-11-01 MED ORDER — ONDANSETRON HCL 4 MG/2ML IJ SOLN
4.0000 mg | Freq: Once | INTRAMUSCULAR | Status: AC
  Administered 2020-11-01: 4 mg via INTRAVENOUS
  Filled 2020-11-01: qty 2

## 2020-11-01 MED ORDER — SODIUM CHLORIDE 0.9 % IV SOLN
1000.0000 mL | INTRAVENOUS | Status: DC
  Administered 2020-11-01: 1000 mL via INTRAVENOUS

## 2020-11-01 MED ORDER — MORPHINE SULFATE (PF) 4 MG/ML IV SOLN
4.0000 mg | Freq: Once | INTRAVENOUS | Status: AC
Start: 2020-11-01 — End: 2020-11-01
  Administered 2020-11-01: 4 mg via INTRAVENOUS
  Filled 2020-11-01: qty 1

## 2020-11-01 NOTE — ED Triage Notes (Signed)
Pt reports occ right side back and abd pain for months, became more severe on Sunday with n/v. Having pain with urination, denies fever. Denies hx of kidney stones.

## 2020-11-01 NOTE — ED Provider Notes (Signed)
[facility] EMERGENCY DEPARTMENT Provider Note   CSN: [id number] Arrival date & time: 11/01/20  0840     History Chief Complaint  Patient presents with   Back Pain   Abdominal Pain    Todd Rose is a 45 y.o. male.   Back Pain Associated symptoms: abdominal pain   Abdominal Pain  Patient states he has had some issues with right-sided back pain and abdominal discomfort ongoing for months.  Patient states he has seen GI doctors in the past.  Initially he attributed his symptoms to his GI problems.  However this weekend the symptoms have become more severe.  He states he has not been able to eat or drink anything for several days.  He started having sharp pain in his right flank that felt as if someone kicked him.  He has had some discomfort with urination.  He denies any diarrhea.  No fevers.  Past Medical History:  Diagnosis Date   Back pain    Benign hypertension 08/05/2019   Bipolar 1 disorder ([place]) 08/05/2019   BPH with obstruction/lower urinary tract symptoms 06/21/2019   Chronic midline low back pain with right-sided sciatica 10/25/2019   Last Assessment & Plan:  Formatting of this note might be different from the original. This is a 45 year old gentleman with persistent and constant low back pain.  It has been going on for years but got worse over the last 2 years.  It goes down the side of his leg to his foot.  He is on disability for his back.  However he does have 5 children.  Some of those kids he has to lift up and take care    Cigarette nicotine dependence without complication 3/47/4259   DDD (degenerative disc disease), lumbar 04/29/2019   Last Assessment & Plan:  Very pleasant 45 year old overweight male with multilevel degenerative changes lumbar spine including degenerative disc disease, neural foraminal stenosis, ligamentum flavum hypertrophy and lumbar radiculopathy.  The patient underwent a right-sided L4-L5 lumbar epidural steroid injection in the  clinic today without apparent complication.  He is a candidate for future injec   Diabetes mellitus without complication ([place])    Gastroenteritis 06/21/2019   Hypertension    Lumbar radiculopathy 04/29/2019   Last Assessment & Plan:  Continue Lyrica 150 mg b.i.d.   Mixed hyperlipidemia 08/05/2019   Morbid obesity ([place]) 01/23/2020   Neural foraminal stenosis of lumbar spine 5/63/8756   Nonalcoholic steatohepatitis ([facility])    Pain medication agreement 05/27/2019   Last Assessment & Plan:  Formatting of this note might be different from the original. UDS completed today.  Review of prior UDS results are within normal limits.   Primary osteoarthritis of right knee 12/16/2013   S/P arthroscopy of knee 12/16/2013   Spinal stenosis of lumbar region with neurogenic claudication 04/29/2019   Last Assessment & Plan:  See degenerative disc plan.   Type II diabetes mellitus with peripheral circulatory disorder ([place]) 08/05/2019    Patient Active Problem List   Diagnosis Date Noted   Lumbar post-laminectomy syndrome 07/11/2020   Obstructive chronic bronchitis with exacerbation ([place]) 05/25/2020   Routine general medical examination at a health care facility 05/25/2020   Palpitations 04/13/2020   Chest discomfort 04/13/2020   Fatigue 04/13/2020   Daytime somnolence 04/13/2020   Snoring 04/13/2020   Chest pain of uncertain etiology 43/32/9518   Obesity (BMI 30-39.9) 04/13/2020   Depression 04/13/2020   Grief 04/13/2020   Shortness of breath 04/13/2020   Hypertension  Diabetes mellitus without complication (HCC)    Back pain    Morbid obesity ([place]) 01/23/2020   Cigarette nicotine dependence without complication 41/28/7867   Chronic midline low back pain with right-sided sciatica 10/25/2019   Neural foraminal stenosis of lumbar spine 10/01/2019   Type II diabetes mellitus with peripheral circulatory disorder ([place]) 08/05/2019   Benign hypertension 08/05/2019   Bipolar 1 disorder ([place]) 08/05/2019    Mixed hyperlipidemia 67/20/9470   Nonalcoholic steatohepatitis ([facility])    Gastroenteritis 06/21/2019   BPH with obstruction/lower urinary tract symptoms 06/21/2019   Pain medication agreement 05/27/2019   DDD (degenerative disc disease), lumbar 04/29/2019   Lumbar radiculopathy 04/29/2019   Spinal stenosis of lumbar region with neurogenic claudication 04/29/2019   Primary osteoarthritis of right knee 12/16/2013   S/P arthroscopy of knee 12/16/2013    Past Surgical History:  Procedure Laterality Date   BACK SURGERY  12/28/2019   CHOLECYSTECTOMY     KNEE SURGERY     SHOULDER SURGERY     left       Family History  Problem Relation Age of Onset   Cancer Mother    Heart attack Sister     Social History   Tobacco Use   Smoking status: Former    Packs/day: 0.00    Pack years: 0.00    Types: Cigarettes    Quit date: 05/05/2020    Years since quitting: 0.4   Smokeless tobacco: Former  Scientific laboratory technician Use: Never used  Substance Use Topics   Alcohol use: Not Currently   Drug use: Not Currently    Home Medications Prior to Admission medications   Medication Sig Start Date End Date Taking? Authorizing Provider  prochlorperazine (COMPAZINE) 10 MG tablet Take 1 tablet (10 mg total) by mouth every 8 (eight) hours as needed for nausea or vomiting. 11/01/20  Yes Dorie Rank, MD  amitriptyline (ELAVIL) 25 MG tablet Take 25 mg by mouth at bedtime. 06/20/20   [provider]  gabapentin (NEURONTIN) 800 MG tablet Take 1 tablet by mouth 4 times daily Patient taking differently: Take 800 mg by mouth in the morning, at noon, in the evening, and at bedtime. 10/25/19   Lillard Anes, MD  HYDROcodone-acetaminophen (NORCO) 7.5-325 MG tablet Take 1 tablet by mouth every 6 (six) hours as needed for moderate pain.    [provider]  ibuprofen (ADVIL) 200 MG tablet Take 200 mg by mouth every 6 (six) hours as needed for moderate pain.  01/02/20   [provider]   lisinopril (ZESTRIL) 40 MG tablet Take 1 tablet by mouth once daily 03/15/20   Lillard Anes, MD  metoCLOPramide (REGLAN) 10 MG tablet Take 1 tablet (10 mg total) by mouth every 8 (eight) hours as needed for nausea (nausea/headache). 04/01/20   Jacqlyn Larsen, PA-C  metoprolol tartrate (LOPRESSOR) 100 MG tablet Take 1 tablet (100 mg total) by mouth once for 1 dose. 2 hours before ct. 04/13/20 04/13/20  Tobb, Kardie, DO  omeprazole (PRILOSEC) 40 MG capsule Take 40 mg by mouth 2 (two) times daily. 04/13/19   [provider]  pantoprazole (PROTONIX) 40 MG tablet Take 40 mg by mouth daily. 03/31/20   [provider]  pravastatin (PRAVACHOL) 40 MG tablet Take 1 tablet by mouth once daily 10/14/20   Lillard Anes, MD    Allergies    Naproxen and Tramadol  Review of Systems   Review of Systems  Gastrointestinal:  Positive for abdominal pain.  Musculoskeletal:  Positive for back pain.  All other systems reviewed and are negative.  Physical Exam Updated Vital Signs BP (!) 154/105 (BP Location: Right Arm)   Pulse 68   Temp 98.5 F (36.9 C) (Oral)   Resp 18   SpO2 99%   Physical Exam Vitals and nursing note reviewed.  Constitutional:      General: He is not in acute distress.    Appearance: He is well-developed.  HENT:     Head: Normocephalic and atraumatic.     Right Ear: External ear normal.     Left Ear: External ear normal.  Eyes:     General: No scleral icterus.       Right eye: No discharge.        Left eye: No discharge.     Conjunctiva/sclera: Conjunctivae normal.  Neck:     Trachea: No tracheal deviation.  Cardiovascular:     Rate and Rhythm: Normal rate and regular rhythm.  Pulmonary:     Effort: Pulmonary effort is normal. No respiratory distress.     Breath sounds: Normal breath sounds. No stridor. No wheezing or rales.  Abdominal:     General: Bowel sounds are normal. There is no distension.     Palpations: Abdomen is soft.      Tenderness: There is no abdominal tenderness. There is right CVA tenderness. There is no guarding or rebound.  Musculoskeletal:        General: No tenderness or deformity.     Cervical back: Neck supple.  Skin:    General: Skin is warm and dry.     Findings: No rash.  Neurological:     General: No focal deficit present.     Mental Status: He is alert.     Cranial Nerves: No cranial nerve deficit (no facial droop, extraocular movements intact, no slurred speech).     Sensory: No sensory deficit.     Motor: No abnormal muscle tone or seizure activity.     Coordination: Coordination normal.  Psychiatric:        Mood and Affect: Mood normal.    ED Results / Procedures / Treatments   Labs (all labs ordered are listed, but only abnormal results are displayed) Labs Reviewed  COMPREHENSIVE METABOLIC PANEL - Abnormal; Notable for the following components:      Result Value   Sodium 134 (*)    Chloride 96 (*)    Glucose, Bld 148 (*)    Total Bilirubin 1.9 (*)    All other components within normal limits  CBC - Abnormal; Notable for the following components:   WBC 13.8 (*)    All other components within normal limits  URINALYSIS, ROUTINE W REFLEX MICROSCOPIC - Abnormal; Notable for the following components:   Ketones, ur 80 (*)    Protein, ur 30 (*)    All other components within normal limits  CBG MONITORING, ED - Abnormal; Notable for the following components:   Glucose-Capillary 149 (*)    All other components within normal limits  LIPASE, BLOOD    EKG None  Radiology CT Renal Stone Study  Result Date: 11/01/2020 CLINICAL DATA:  Nausea vomiting, right flank pain. EXAM: CT ABDOMEN AND PELVIS WITHOUT CONTRAST TECHNIQUE: Multidetector CT imaging of the abdomen and pelvis was performed following the standard protocol without IV contrast. COMPARISON:  04/01/2020. FINDINGS: Lower chest: Lung bases show no acute findings. Heart is at the upper limits of normal in size. No pericardial  or pleural effusion.  Distal esophagus is unremarkable. Hepatobiliary: Liver is decreased in attenuation diffusely. Cholecystectomy. No biliary ductal dilatation. Pancreas: Negative. Spleen: Negative. Adrenals/Urinary Tract: Adrenal glands and kidneys are unremarkable. Ureters are decompressed. Bladder is grossly unremarkable. Stomach/Bowel: Stomach, small bowel, appendix and colon are unremarkable. Vascular/Lymphatic: Atherosclerotic calcification of the aorta. No pathologically enlarged lymph nodes. 10 mm right external iliac lymph node is unchanged. Reproductive: Prostate is visualized. Other: No free fluid.  Mesenteries and peritoneum are unremarkable. Musculoskeletal: No worrisome lytic or sclerotic lesions. IMPRESSION: 1. No findings to explain the patient's clinical history. 2. Hepatic steatosis. 3.  Aortic atherosclerosis (ICD10-I70.0). Electronically Signed   By: Lorin Picket M.D.   On: 11/01/2020 14:26    Procedures Procedures   Medications Ordered in ED Medications  sodium chloride 0.9 % bolus 1,000 mL (0 mLs Intravenous Stopped 11/01/20 1416)    Followed by  0.9 %  sodium chloride infusion (1,000 mLs Intravenous New Bag/Given 11/01/20 1208)  ondansetron (ZOFRAN-ODT) disintegrating tablet 4 mg (4 mg Oral Given 11/01/20 0852)  ondansetron (ZOFRAN) injection 4 mg (4 mg Intravenous Given 11/01/20 1208)  morphine 4 MG/ML injection 4 mg (4 mg Intravenous Given 11/01/20 1211)  prochlorperazine (COMPAZINE) injection 10 mg (10 mg Intravenous Given 11/01/20 1428)  morphine 4 MG/ML injection 4 mg (4 mg Intravenous Given 11/01/20 1430)    ED Course  I have reviewed the triage vital signs and the nursing notes.  Pertinent labs & imaging results that were available during my care of the patient were reviewed by me and considered in my medical decision making (see chart for details).  Clinical Course as of 11/01/20 1507  Wed Nov 02, 6235  6283 Metabolic panel is normal.  Lipase is normal.  CBC does  show elevated white blood cell count [JK]  1156 Reviewed prior records.  Patient did have an appointment with his pain management doctor yesterday.  He was given refill of his hydrocodone.  Patient sees pain management for right-sided back pain [JK]  1435 CT scan without acute findings [JK]    Clinical Course User Index [JK] Dorie Rank, MD   MDM Rules/Calculators/A&P                          Patient presented to the ED for evaluation of abdominal pain, flank pain and vomiting.  Presentation was concerning for the possibility obstruction versus renal colic.  His laboratory test did not suggest hepatitis or pancreatitis.  CT scan was performed.  No signs of acute abnormality.  No ureteral stone.  No obstruction.  Patient was treated with IV fluids and pain medications.  He does have opiate prescriptions that he takes chronically to manage his pain at home.  We will give him prescription for Compazine for nausea as he did not find the Zofran effective.  Also discussed doing a COVID test as the patient has not been vaccinated.  He is not having shortness of breath or cough.  He does not want to have that done Final Clinical Impression(s) / ED Diagnoses Final diagnoses:  Flank pain  Nausea and vomiting, intractability of vomiting not specified, unspecified vomiting type    Rx / DC Orders ED Discharge Orders          Ordered    prochlorperazine (COMPAZINE) 10 MG tablet  Every 8 hours PRN        11/01/20 1506             Dorie Rank, MD 11/01/20 [phone]

## 2020-11-01 NOTE — Discharge Instructions (Addendum)
You were given morphine for pain while you are in the emergency room.  It is not safe to drive until at least 4 hours after your last dose.  Take the nausea medication as needed.  Follow-up with your primary care doctor to be rechecked

## 2020-11-01 NOTE — ED Notes (Signed)
Pt educated on safe discharge after morphine administration. Pt agrees to and  understands that he is not to drive for 4 hours after administration.

## 2020-11-01 NOTE — ED Notes (Signed)
Got patient some blankets

## 2020-11-03 IMAGING — CT CT ABDOMEN AND PELVIS WITH CONTRAST
2 of 5 series · 16 of 46 positions shown, 18 images · IV contrast (APPLIED)
Comparison: March 18, 2018.

CLINICAL DATA: Epigastric region pain with nausea and vomiting

EXAM:
CT ABDOMEN AND PELVIS WITH CONTRAST
TECHNIQUE: Multidetector CT imaging of the abdomen and pelvis was performed
using the standard protocol following bolus administration of
intravenous contrast.
CONTRAST:  100mL OMNIPAQUE IOHEXOL 300 MG/ML  SOLN

[Series 3: abd/ pelvis 5.0 i30f 2 · axial · 0.89mm/px · z∈[+626,+1076]mm · 13 of 102 slices shown, 15 images]
[im 6/102  soft-tissue]
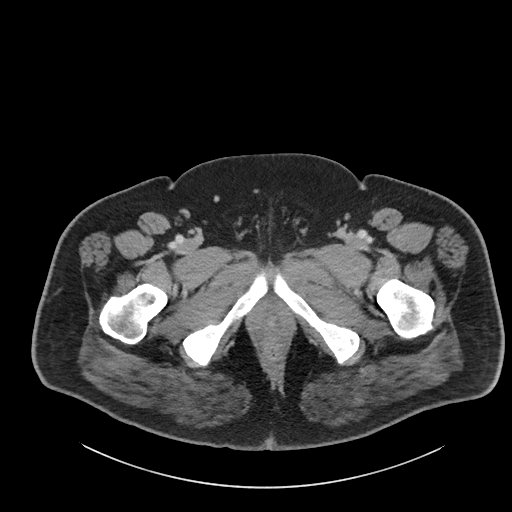
[im 6/102  bone]
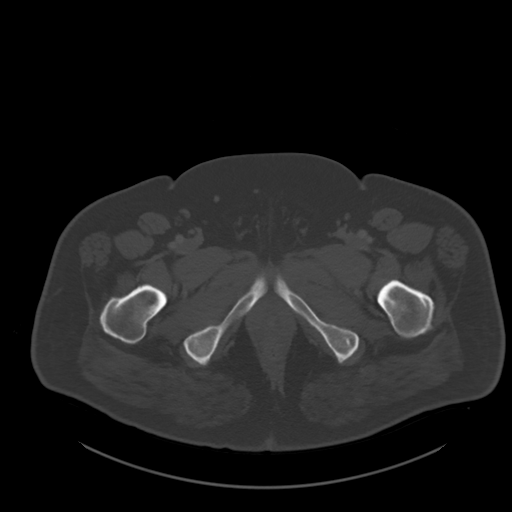
[im 12/102  soft-tissue]
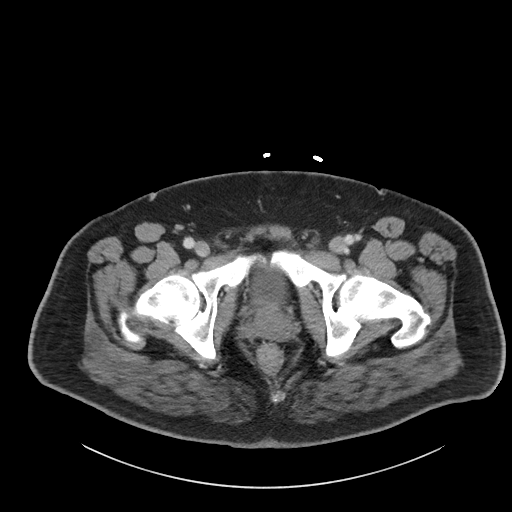
[im 23/102  soft-tissue]
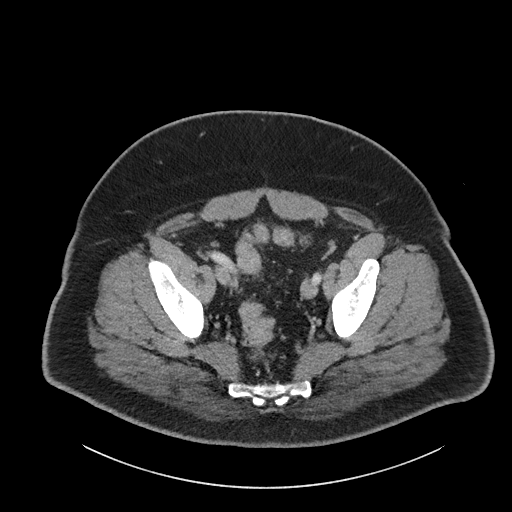
[im 29/102  soft-tissue]
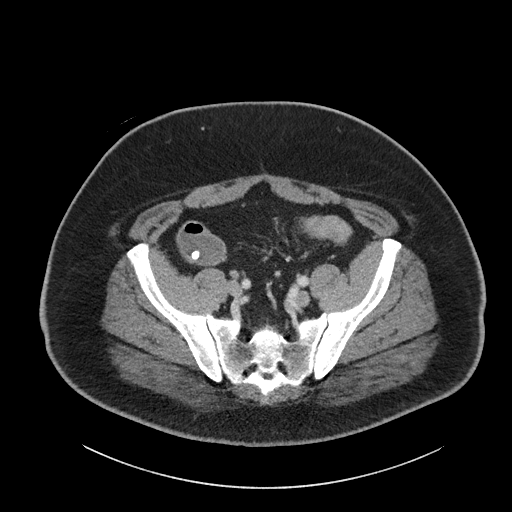
[im 34/102  soft-tissue]
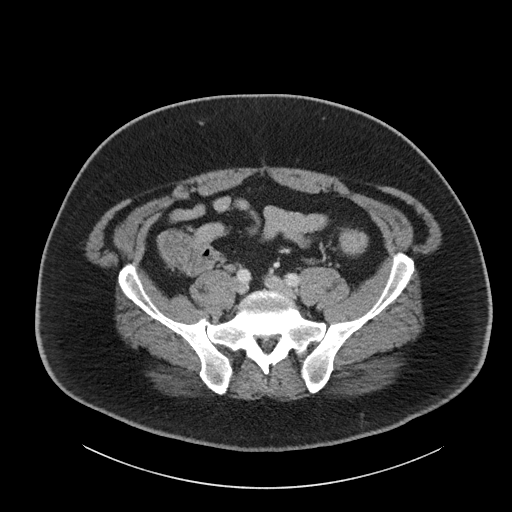
[im 45/102  soft-tissue]
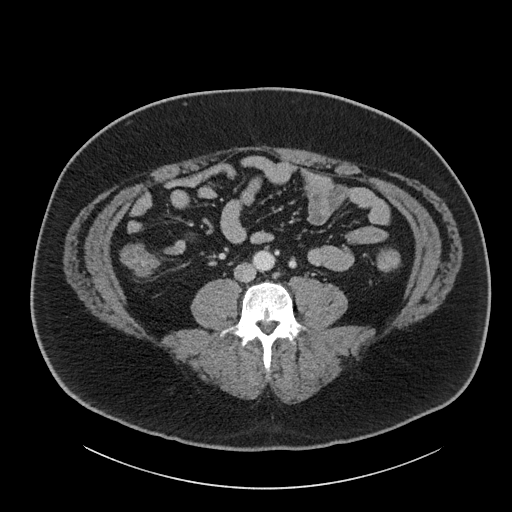
[im 51/102  soft-tissue]
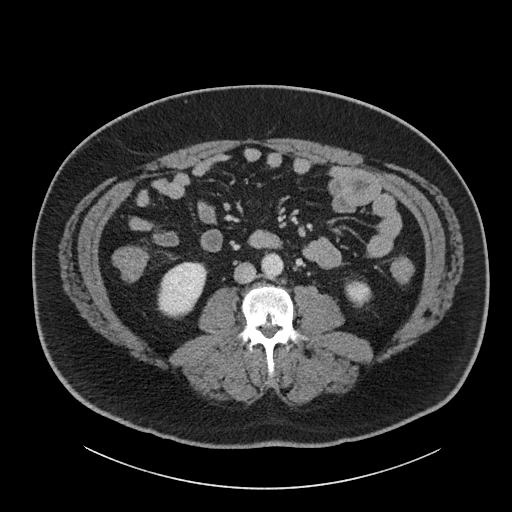
[im 57/102  soft-tissue]
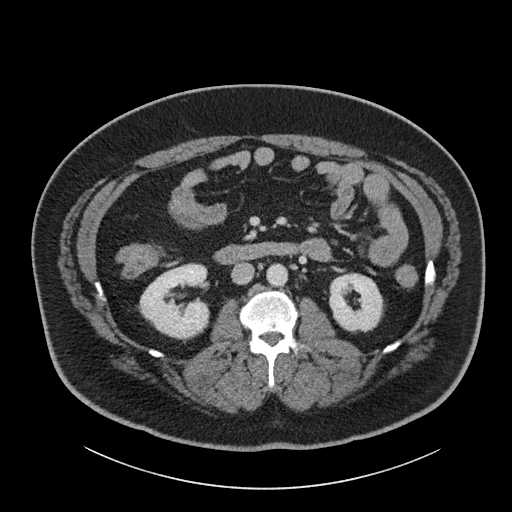
[im 68/102  soft-tissue]
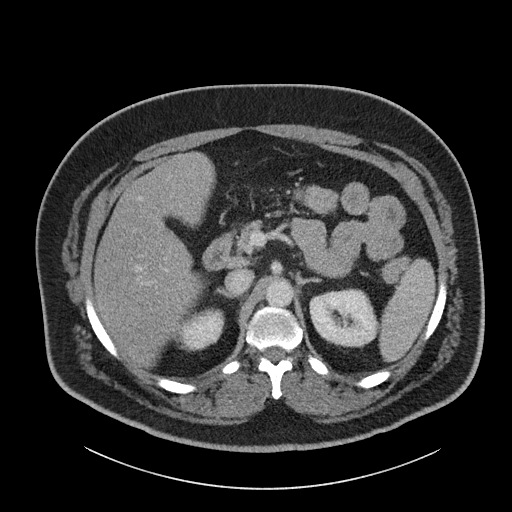
[im 68/102  bone]
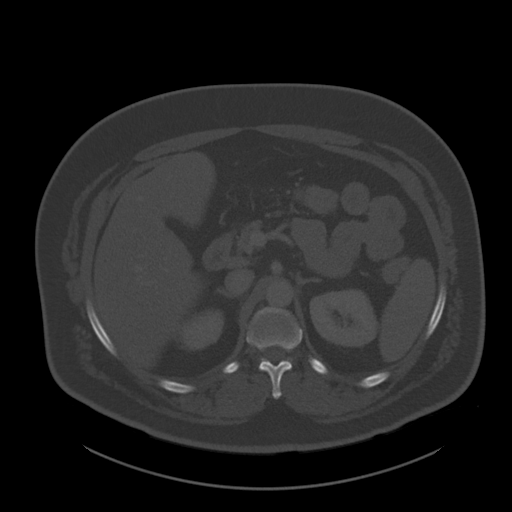
[im 73/102  soft-tissue]
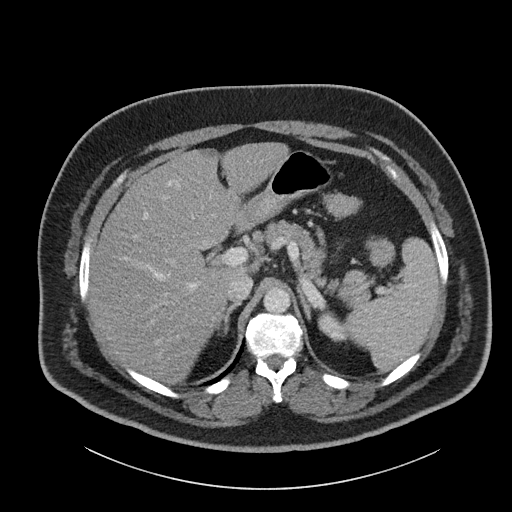
[im 79/102  soft-tissue]
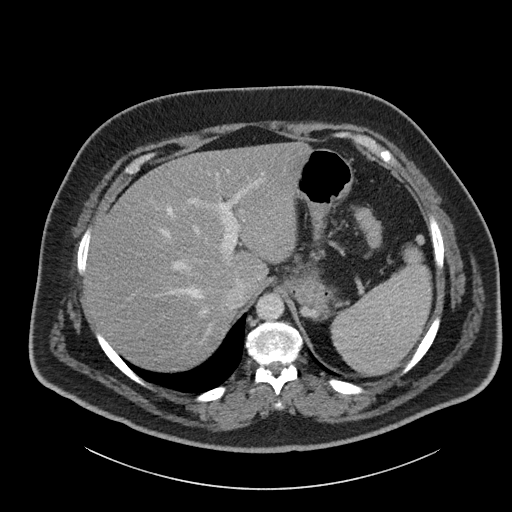
[im 90/102  soft-tissue]
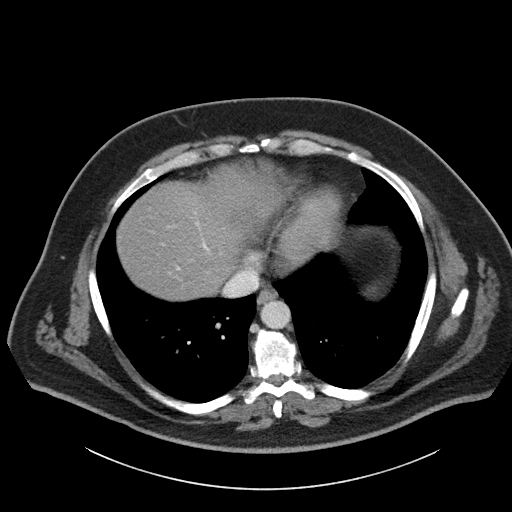
[im 96/102  soft-tissue]
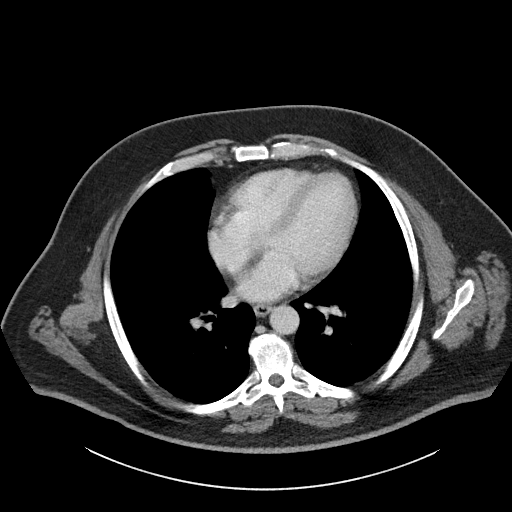

[Series 6: coronal soft tissue · coronal · 0.90mm/px · 3 of 115 slices shown]
[im 39/115  soft-tissue]
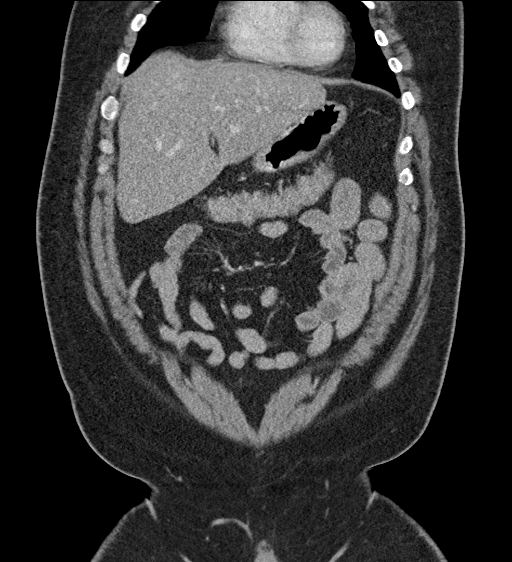
[im 51/115  soft-tissue]
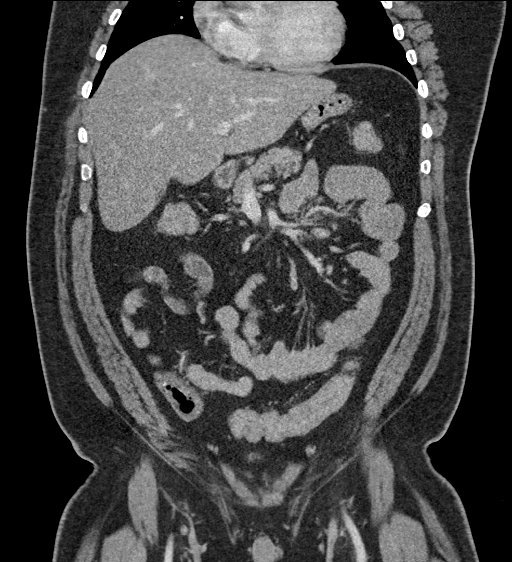
[im 64/115  soft-tissue]
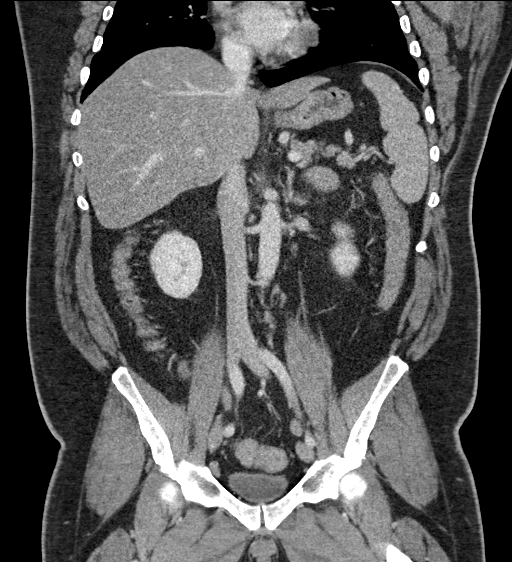

[16 of 46 positions shown; findings below may reference images not displayed]

FINDINGS: Lower chest: Lung bases are clear.

Hepatobiliary: There is hepatic steatosis. No focal liver lesions
are appreciable. The gallbladder is absent. There is no biliary duct
dilatation.

Pancreas: There is no pancreatic mass or inflammatory focus.

Spleen: No splenic lesions are evident.

Adrenals/Urinary Tract: Adrenals bilaterally appear unremarkable.
Kidneys bilaterally show no evident mass or hydronephrosis on either
side. There is no appreciable renal or ureteral calculus on either
side. Urinary bladder is midline with wall thickness within normal
limits.

Stomach/Bowel: There is no appreciable bowel wall or mesenteric
thickening. No evident bowel obstruction. There is no appreciable
free air or portal venous air.

Vascular/Lymphatic: There is no abdominal aortic aneurysm. No
vascular lesions are evident. There is no adenopathy in the abdomen
or pelvis.

Reproductive: Prostate and seminal vesicles are normal in size and
contour. A tiny calcification is noted in the prostate.

Other: Appendix appears normal. There is no evident abscess or
ascites in the abdomen or pelvis.

Musculoskeletal: There is moderately severe spinal stenosis at L4-5
due to diffuse disc protrusion and bony hypertrophy. There are no
blastic or lytic bone lesions. No intramuscular lesions are evident.
IMPRESSION: 1. Moderately severe spinal stenosis at L4-5, multifactorial in
etiology.

2. No evident bowel obstruction. No abscess in the abdomen or
pelvis. Appendix appears normal.

3. No renal or ureteral calculus. No hydronephrosis. Urinary bladder
wall thickness within normal limits.

4.  Hepatic steatosis.

5.  Gallbladder absent.

## 2020-11-10 ENCOUNTER — Telehealth: Payer: Self-pay | Admitting: Legal Medicine

## 2020-11-10 NOTE — Chronic Care Management (AMB) (Signed)
  Chronic Care Management   Note  11/10/2020 Name: Todd Rose MRN: [id number] DOB: [birth year]  [name] is a 45 y.o. year old male who is a primary care patient of Lillard Anes, MD. I reached out to [place] by phone today in response to a referral sent by [name]'s PCP, Lillard Anes, MD.   [name] was given information about Chronic Care Management services today including:  CCM service includes personalized support from designated clinical staff supervised by his physician, including individualized plan of care and coordination with other care providers 24/7 contact phone numbers for assistance for urgent and routine care needs. Service will only be billed when office clinical staff spend 20 minutes or more in a month to coordinate care. Only one practitioner may furnish and bill the service in a calendar month. The patient may stop CCM services at any time (effective at the end of the month) by phone call to the office staff.   Patient agreed to services and verbal consent obtained.   Follow up plan:   [name]

## 2020-12-07 ENCOUNTER — Other Ambulatory Visit: Payer: Self-pay | Admitting: Legal Medicine

## 2020-12-22 ENCOUNTER — Telehealth: Payer: Self-pay

## 2020-12-22 NOTE — Chronic Care Management (AMB) (Signed)
Chronic Care Management Pharmacy Assistant   Name: Todd Rose  MRN: [id number] DOB: Oct 08, 1975  Todd Rose is an 45 y.o. year old male who presents for his initial CCM visit with the clinical pharmacist.  Reason for Encounter: Chart Prep/ IQ  Recent office visits:  12/25/20- Reinaldo Meeker, MD- seen for dizziness, started meclizine 25 mg tid prn, labs ordered, CT ordered, follow up 2 weeks  Recent consult visits:  10/31/20- Mauricio Po, PA-C ( Pain Medicine)- seen for medication follow up and back pain, no medication changes, follow up 8-12 weeks 07/11/20- Mauricio Po, PA-C ( Pain Medicine)- seen for medication follow up and back pain, no medication changes, follow up 8-12 weeks  Hospital visits:  Medication Reconciliation was completed by comparing discharge summary, patient's EMR and Pharmacy list, and upon discussion with patient.  Same day discharge from [facility] Emergency Department on 11/01/20 due to flank plain  New?Medications Started at [facility] Discharge:?? Prochlorperazine maleate 10 mg q8h prn   All other medications  remain the same after Hospital Discharge:??    Medications: Outpatient Encounter Medications as of 12/22/2020  Medication Sig   amitriptyline (ELAVIL) 25 MG tablet Take 25 mg by mouth at bedtime.   gabapentin (NEURONTIN) 800 MG tablet Take 1 tablet by mouth 4 times daily (Patient taking differently: Take 800 mg by mouth in the morning, at noon, in the evening, and at bedtime.)   HYDROcodone-acetaminophen (NORCO) 7.5-325 MG tablet Take 1 tablet by mouth every 6 (six) hours as needed for moderate pain.   ibuprofen (ADVIL) 200 MG tablet Take 200 mg by mouth every 6 (six) hours as needed for moderate pain.    lisinopril (ZESTRIL) 40 MG tablet Take 1 tablet by mouth once daily   metoCLOPramide (REGLAN) 10 MG tablet Take 1 tablet (10 mg total) by mouth every 8 (eight) hours as needed for nausea (nausea/headache).   metoprolol  tartrate (LOPRESSOR) 100 MG tablet Take 1 tablet (100 mg total) by mouth once for 1 dose. 2 hours before ct.   omeprazole (PRILOSEC) 40 MG capsule Take 40 mg by mouth 2 (two) times daily.   pantoprazole (PROTONIX) 40 MG tablet Take 40 mg by mouth daily.   pravastatin (PRAVACHOL) 40 MG tablet Take 1 tablet by mouth once daily   prochlorperazine (COMPAZINE) 10 MG tablet Take 1 tablet (10 mg total) by mouth every 8 (eight) hours as needed for nausea or vomiting.   No facility-administered encounter medications on file as of 12/22/2020.    Lab Results  Component Value Date/Time   HGBA1C 7.1 (H) 08/05/2019 10:20 AM     BP Readings from Last 3 Encounters:  11/01/20 (!) 187/120  05/25/20 110/80  04/13/20 118/82   Several unsuccessful attempts made to contact patient  Current Documented Medications amitriptyline  25 MG -90 DS last filed 06/20/20 gabapentin  800 MG- 30 DS last filled 11/23/20 NORCO 7.5-325 MG- 30 DS last filled 11/26/20 ibuprofen  200 MG tablet metoCLOPramide 10 MG - 4 DS last filled 04/01/20 metoprolol tartrate 100 MG tablet  omeprazole 40 MG- 30 DS last filled 07/11/19 pantoprazole 40 MG- 30 DS last filled 06/28/20 prochlorperazine 10 MG - 4 DS last filled 11/01/20  Star Rating Drugs:  pravastatin 40 MG- 90 DS last filled 10/31/20 lisinopril  40 MG- 90 DS last filled 12/07/20   Have you seen any other providers since your last visit with PCP?   Any changes in your medications or health?   Any side effects  from any medications?   Do you have an symptoms or problems not managed by your medications?   Any concerns about your health right now?   Has your provider asked that you check blood pressure, blood sugar, or follow special diet at home?   Do you get any type of exercise on a regular basis?   Can you think of a goal you would like to reach for your health?   Do you have any problems getting your medications?   Is there anything that you would like to  discuss during the appointment?    Todd Rose was reminded to have all medications, supplements and any blood glucose and blood pressure readings available for review with [name]. D, at his office visit on 08/18 at 11 am .   Care Gaps: Last annual wellness visit? 42/32/00 If applicable: Last eye exam / retinopathy screening? None noted Last diabetic foot exam?None noted  [facility] CPA, CMA

## 2020-12-25 ENCOUNTER — Ambulatory Visit (INDEPENDENT_AMBULATORY_CARE_PROVIDER_SITE_OTHER): Payer: Medicare Other | Admitting: Legal Medicine

## 2020-12-25 ENCOUNTER — Encounter: Payer: Self-pay | Admitting: Legal Medicine

## 2020-12-25 ENCOUNTER — Other Ambulatory Visit: Payer: Self-pay

## 2020-12-25 ENCOUNTER — Telehealth: Payer: Self-pay

## 2020-12-25 VITALS — BP 110/88 | HR 72 | Temp 97.4°F | Ht 67.0 in | Wt 230.6 lb

## 2020-12-25 DIAGNOSIS — R42 Dizziness and giddiness: Secondary | ICD-10-CM

## 2020-12-25 DIAGNOSIS — I10 Essential (primary) hypertension: Secondary | ICD-10-CM | POA: Diagnosis not present

## 2020-12-25 DIAGNOSIS — F319 Bipolar disorder, unspecified: Secondary | ICD-10-CM

## 2020-12-25 DIAGNOSIS — M21831 Other specified acquired deformities of right forearm: Secondary | ICD-10-CM | POA: Diagnosis not present

## 2020-12-25 DIAGNOSIS — E669 Obesity, unspecified: Secondary | ICD-10-CM

## 2020-12-25 DIAGNOSIS — G458 Other transient cerebral ischemic attacks and related syndromes: Secondary | ICD-10-CM | POA: Diagnosis not present

## 2020-12-25 DIAGNOSIS — R519 Headache, unspecified: Secondary | ICD-10-CM | POA: Diagnosis not present

## 2020-12-25 DIAGNOSIS — E782 Mixed hyperlipidemia: Secondary | ICD-10-CM

## 2020-12-25 DIAGNOSIS — E1151 Type 2 diabetes mellitus with diabetic peripheral angiopathy without gangrene: Secondary | ICD-10-CM | POA: Diagnosis not present

## 2020-12-25 DIAGNOSIS — Z0289 Encounter for other administrative examinations: Secondary | ICD-10-CM | POA: Diagnosis not present

## 2020-12-25 MED ORDER — MECLIZINE HCL 25 MG PO TABS: 25.0000 mg | ORAL_TABLET | Freq: Three times a day (TID) | ORAL | 0 refills | Status: AC | PRN

## 2020-12-25 NOTE — Telephone Encounter (Signed)
Pt calling with dizziness and headache. Denies chest pain, heart racing. States Saturday he was speaking with male and male said "am I making you nervous?" Because pt was struggling with words. Pt had headache at the time then dizziness began. Does not have way to check BP.   Made same day appointment.   Harrell Lark 12/25/20 8:13 AM

## 2020-12-25 NOTE — Progress Notes (Signed)
Established Patient Office Visit  Subjective:  Patient ID: Todd Rose, male    DOB: 03/16/1976  Age: 45 y.o. MRN: [id number]  CC:  Chief Complaint  Patient presents with   Dizziness    Patient states he started stuttering Saturday and vision is off, if he looks to the left he states everything goes to the right.     HPI Todd Rose presents for dizziness over weekend, stuttering.  True vertigo  Patient present with type 2 diabetes.  Specifically, this is type 2, nonsinsulin requiring diabetes, complicated by pvd.  Compliance with treatment has been good; patient take medicines as directed, maintains diet and exercise regimen, follows up as directed, and is keeping glucose diary.  Date of  diagnosis 2018.  Depression screen has been performed.Tobacco screen non smoker. Current medicines for diabetes none.  Patient is on lisinopril for renal protection and pravatatin  for cholesterol control.  Patient performs foot exams daily and last ophthalmologic exam was no  .   Past Medical History:  Diagnosis Date   Back pain    Benign hypertension 08/05/2019   Bipolar 1 disorder ([place]) 08/05/2019   BPH with obstruction/lower urinary tract symptoms 06/21/2019   Chronic midline low back pain with right-sided sciatica 10/25/2019   Last Assessment & Plan:  Formatting of this note might be different from the original. This is a 45 year old gentleman with persistent and constant low back pain.  It has been going on for years but got worse over the last 2 years.  It goes down the side of his leg to his foot.  He is on disability for his back.  However he does have 5 children.  Some of those kids he has to lift up and take care    Cigarette nicotine dependence without complication 07/11/3141   DDD (degenerative disc disease), lumbar 04/29/2019   Last Assessment & Plan:  Very pleasant 45 year old overweight male with multilevel degenerative changes lumbar spine including degenerative disc disease, neural  foraminal stenosis, ligamentum flavum hypertrophy and lumbar radiculopathy.  The patient underwent a right-sided L4-L5 lumbar epidural steroid injection in the clinic today without apparent complication.  He is a candidate for future injec   Diabetes mellitus without complication ([place])    Gastroenteritis 06/21/2019   Hypertension    Lumbar radiculopathy 04/29/2019   Last Assessment & Plan:  Continue Lyrica 150 mg b.i.d.   Mixed hyperlipidemia 08/05/2019   Morbid obesity ([place]) 01/23/2020   Neural foraminal stenosis of lumbar spine 8/88/7579   Nonalcoholic steatohepatitis ([facility])    Pain medication agreement 05/27/2019   Last Assessment & Plan:  Formatting of this note might be different from the original. UDS completed today.  Review of prior UDS results are within normal limits.   Primary osteoarthritis of right knee 12/16/2013   S/P arthroscopy of knee 12/16/2013   Spinal stenosis of lumbar region with neurogenic claudication 04/29/2019   Last Assessment & Plan:  See degenerative disc plan.   Type II diabetes mellitus with peripheral circulatory disorder ([place]) 08/05/2019    Past Surgical History:  Procedure Laterality Date   BACK SURGERY  12/28/2019   CHOLECYSTECTOMY     KNEE SURGERY     SHOULDER SURGERY     left    Family History  Problem Relation Age of Onset   Cancer Mother    Heart attack Sister     Social History   Socioeconomic History   Marital status: Married    Spouse name: Not on file  Number of children: Not on file   Years of education: Not on file   Highest education level: Not on file  Occupational History   Not on file  Tobacco Use   Smoking status: Former    Packs/day: 0.00    Types: Cigarettes    Quit date: 05/05/2020    Years since quitting: 0.6   Smokeless tobacco: Former  Scientific laboratory technician Use: Never used  Substance and Sexual Activity   Alcohol use: Not Currently   Drug use: Not Currently   Sexual activity: Yes    Partners: Female  Other Topics  Concern   Not on file  Social History Narrative   Not on file   Social Determinants of Health   Financial Resource Strain: Low Risk    Difficulty of Paying Living Expenses: Not hard at all  Food Insecurity: No Food Insecurity   Worried About Charity fundraiser in the Last Year: Never true   Arboriculturist in the Last Year: Never true  Transportation Needs: No Transportation Needs   Lack of Transportation (Medical): No   Lack of Transportation (Non-Medical): No  Physical Activity: Insufficiently Active   Days of Exercise per Week: 2 days   Minutes of Exercise per Session: 30 min  Stress: Stress Concern Present   Feeling of Stress : To some extent  Social Connections: Moderately Isolated   Frequency of Communication with Friends and Family: Twice a week   Frequency of Social Gatherings with Friends and Family: Never   Attends Religious Services: More than 4 times per year   Active Member of [employer] or Organizations: No   Attends Music therapist: Never   Marital Status: Married  Human resources officer Violence: Not At Risk   Fear of Current or Ex-Partner: No   Emotionally Abused: No   Physically Abused: No   Sexually Abused: No    Outpatient Medications Prior to Visit  Medication Sig Dispense Refill   amitriptyline (ELAVIL) 25 MG tablet Take 25 mg by mouth at bedtime.     gabapentin (NEURONTIN) 800 MG tablet Take 1 tablet by mouth 4 times daily (Patient taking differently: Take 800 mg by mouth in the morning, at noon, in the evening, and at bedtime.) 120 tablet 4   HYDROcodone-acetaminophen (NORCO) 7.5-325 MG tablet Take 1 tablet by mouth every 6 (six) hours as needed for moderate pain.     ibuprofen (ADVIL) 200 MG tablet Take 200 mg by mouth every 6 (six) hours as needed for moderate pain.      lisinopril (ZESTRIL) 40 MG tablet Take 1 tablet by mouth once daily 90 tablet 2   omeprazole (PRILOSEC) 40 MG capsule Take 40 mg by mouth 2 (two) times daily.     pravastatin  (PRAVACHOL) 40 MG tablet Take 1 tablet by mouth once daily 90 tablet 2   metoCLOPramide (REGLAN) 10 MG tablet Take 1 tablet (10 mg total) by mouth every 8 (eight) hours as needed for nausea (nausea/headache). 10 tablet 0   pantoprazole (PROTONIX) 40 MG tablet Take 40 mg by mouth daily.     prochlorperazine (COMPAZINE) 10 MG tablet Take 1 tablet (10 mg total) by mouth every 8 (eight) hours as needed for nausea or vomiting. 12 tablet 0   metoprolol tartrate (LOPRESSOR) 100 MG tablet Take 1 tablet (100 mg total) by mouth once for 1 dose. 2 hours before ct. 1 tablet 0   No facility-administered medications prior to visit.  Allergies  Allergen Reactions   Naproxen    Tramadol Other (See Comments)    Nausea and headache    ROS Review of Systems  Constitutional:  Negative for activity change and appetite change.  HENT:  Negative for congestion.   Eyes:  Negative for visual disturbance.  Respiratory:  Negative for chest tightness and shortness of breath.   Cardiovascular:  Negative for chest pain and palpitations.  Gastrointestinal:  Negative for abdominal distention and abdominal pain.  Endocrine: Positive for polyuria.  Genitourinary:  Negative for difficulty urinating and dysuria.  Musculoskeletal:  Negative for arthralgias and back pain.  Neurological: Negative.        Ataxic  Psychiatric/Behavioral: Negative.       Objective:    Physical Exam Constitutional:      Appearance: Normal appearance.  HENT:     Head: Normocephalic.     Right Ear: Tympanic membrane, ear canal and external ear normal.     Left Ear: Tympanic membrane, ear canal and external ear normal.  Eyes:     General: No visual field deficit.    Extraocular Movements: Extraocular movements intact.     Conjunctiva/sclera: Conjunctivae normal.     Pupils: Pupils are equal, round, and reactive to light.     Comments: No nystagmus  Cardiovascular:     Rate and Rhythm: Normal rate and regular rhythm.     Pulses:  Normal pulses.     Heart sounds: Normal heart sounds. No murmur heard.   No gallop.  Pulmonary:     Effort: Pulmonary effort is normal. No respiratory distress.     Breath sounds: No wheezing.  Abdominal:     General: Abdomen is flat. Bowel sounds are normal. There is no distension.     Palpations: Abdomen is soft.     Tenderness: There is no abdominal tenderness.  Musculoskeletal:     Cervical back: Normal range of motion.     Right lower leg: No edema.     Left lower leg: No edema.  Skin:    General: Skin is warm.     Capillary Refill: Capillary refill takes less than 2 seconds.  Neurological:     Mental Status: He is alert and oriented to person, place, and time.     Cranial Nerves: No cranial nerve deficit, dysarthria or facial asymmetry.     Sensory: No sensory deficit.     Motor: Weakness (right ar drift) present.     Coordination: Romberg sign positive (very unstable).     Deep Tendon Reflexes: Reflexes normal. Babinski sign absent on the right side. Babinski sign absent on the left side.  Psychiatric:        Mood and Affect: Mood is anxious.    BP 110/88   Pulse 72   Temp (!) 97.4 F (36.3 C)   Ht 5' 7"  (1.702 m)   Wt 230 lb 9.6 oz (104.6 kg)   SpO2 98%   BMI 36.12 kg/m  Wt Readings from Last 3 Encounters:  12/25/20 230 lb 9.6 oz (104.6 kg)  05/25/20 246 lb (111.6 kg)  04/13/20 251 lb (113.9 kg)     Health Maintenance Due  Topic Date Due   PNEUMOCOCCAL POLYSACCHARIDE VACCINE AGE 64-64 HIGH RISK  Never done   COVID-19 Vaccine (1) Never done   Pneumococcal Vaccine 2-66 Years old (1 - PCV) Never done   FOOT EXAM  Never done   OPHTHALMOLOGY EXAM  Never done   Hepatitis C Screening  Never  done   HEMOGLOBIN A1C  02/05/2020   INFLUENZA VACCINE  12/11/2020    There are no preventive care reminders to display for this patient.  No results found for: TSH Lab Results  Component Value Date   WBC 13.8 (H) 11/01/2020   HGB 14.7 11/01/2020   HCT 43.3 11/01/2020    MCV 87.5 11/01/2020   PLT 246 11/01/2020   Lab Results  Component Value Date   NA 134 (L) 11/01/2020   K 3.8 11/01/2020   CO2 28 11/01/2020   GLUCOSE 148 (H) 11/01/2020   BUN 12 11/01/2020   CREATININE 0.82 11/01/2020   BILITOT 1.9 (H) 11/01/2020   ALKPHOS 87 11/01/2020   AST 21 11/01/2020   ALT 22 11/01/2020   PROT 7.2 11/01/2020   ALBUMIN 4.2 11/01/2020   CALCIUM 9.6 11/01/2020   ANIONGAP 10 11/01/2020   No results found for: CHOL No results found for: HDL No results found for: LDLCALC No results found for: TRIG No results found for: CHOLHDL Lab Results  Component Value Date   HGBA1C 7.1 (H) 08/05/2019      Assessment & Plan:   Problem List Items Addressed This Visit       Cardiovascular and Mediastinum      Diagnoses and all orders for this visit: Mixed hyperlipidemia -     Lipid panel -     AMB Referral to [place] [facility] for hyperlipidemia/ cholesterol was established and reinforced today.  The patient's status was assessed using clinical findings on exam, lab and other diagnostic tests. The patient's disease status was assessed based on evidence-based guidelines and found to be fair controlled. MEDICATIONS were reviewed. SELF MANAGEMENT GOALS have been discussed and patient's success at attaining the goal of low cholesterol was assessed. RECOMMENDATION given include regular exercise 3 days a week and low cholesterol/low fat diet. CLINICAL SUMMARY including written plan to identify barriers unique to the patient due to social or economic  reasons was discussed.   Type II diabetes mellitus with peripheral circulatory disorder (HCC) -     Hemoglobin A1c -     AMB Referral to [facility] Patient stopped all diabetes medicines and is not on diet.  We discussed at length  Benign hypertension -     CBC with Differential/Platelet -     Comprehensive metabolic panel -     AMB Referral to [facility] An individual hypertension care plan was established and reinforced today.  The patient's status was assessed using clinical findings on exam and labs or diagnostic tests. The patient's success at meeting treatment goals on disease specific evidence-based guidelines and found to be well controlled. SELF MANAGEMENT: The patient and I together assessed ways to personally work towards obtaining the recommended goals. RECOMMENDATIONS: avoid decongestants found in common cold remedies, decrease consumption of alcohol, perform routine monitoring of BP with home BP cuff, exercise, reduction of dietary salt, take medicines as prescribed, try not to miss doses and quit smoking.  Regular exercise and maintaining a healthy weight is needed.  Stress reduction may help. A CLINICAL SUMMARY including written plan identify barriers to care unique to individual due to social or financial issues.  We attempt to mutually creat solutions for individual and family understanding.   Bipolar 1 disorder (HCC) -     AMB Referral to [place] Patient stopped bipolar medicines and is unwilling to restart  Obesity (BMI 30-39.9) An individualize plan was formulated for obesity  using patient history and physical exam to encourage weight loss.  An evidence based program was formulated.  Patient is to cu tportion size with meals and to plan physical exercise 3 days a week at least 20 minutes.  Weight watchers and other programs are helpful.  Planned amount of weight loss 10 lbs. He meets criteria for morbid obesity with diabetes and hypertension  Morbid obesity ([place]) An individualize plan was formulated for obesity using patient history and physical exam to encourage weight loss.  An evidence based program was formulated.  Patient is to cut portion size with meals and to plan physical exercise 3 days a week at least 20 minutes.  Weight watchers and other programs are helpful.  Planned amount of weight loss  10 lbs.   Pain medication agreement He goes to pain management AN INDIVIDUAL CARE PLAN was established and reinforced today.  The patient's status was assessed using clinical findings on exam, labs, and other diagnostic testing. Patient's success at meeting treatment goals based on disease specific evidence-bassed guidelines and found to be in fair control. RECOMMENDATIONS include maintining present medicines and treatment. He is on chronichydrocodone with no abuse.  Negative REMS.   Vertigo -     CT HEAD WO CONTRAST (5MM) -     meclizine (ANTIVERT) 25 MG tablet; Take 1 tablet (25 mg total) by mouth 3 (three) times daily as needed for dizziness. Ct performed without contrast, no stroke, use meclizine and given note for work this week since his romberg is so unstable  Acute cerebrovascular insufficiency transient focal neurologic deficit  Stat CT to look for acute stroke with new left arm drift and incordination                                      Follow-up: Return in about 2 weeks (around 01/08/2021) for vertigo.    Reinaldo Meeker, MD

## 2020-12-26 LAB — LIPID PANEL
Chol/HDL Ratio: 3.8 ratio (ref 0.0–5.0)
Cholesterol, Total: 152 mg/dL (ref 100–199)
HDL: 40 mg/dL (ref >39)
LDL Chol Calc (NIH): 96 mg/dL (ref 0–99)
Triglycerides: 82 mg/dL (ref 0–149)
VLDL Cholesterol Cal: 16 mg/dL (ref 5–40)

## 2020-12-26 LAB — COMPREHENSIVE METABOLIC PANEL
ALT: 22 IU/L (ref 0–44)
AST: 17 IU/L (ref 0–40)
Albumin/Globulin Ratio: 1.8 (ref 1.2–2.2)
Albumin: 4.4 g/dL (ref 4.0–5.0)
Alkaline Phosphatase: 103 IU/L (ref 44–121)
BUN/Creatinine Ratio: 17 (ref 9–20)
BUN: 12 mg/dL (ref 6–24)
Bilirubin Total: 0.4 mg/dL (ref 0.0–1.2)
CO2: 23 mmol/L (ref 20–29)
Calcium: 9.5 mg/dL (ref 8.7–10.2)
Chloride: 105 mmol/L (ref 96–106)
Creatinine, Ser: 0.72 mg/dL — ABNORMAL LOW (ref 0.76–1.27)
Globulin, Total: 2.4 g/dL (ref 1.5–4.5)
Glucose: 130 mg/dL — ABNORMAL HIGH (ref 65–99)
Potassium: 4.5 mmol/L (ref 3.5–5.2)
Sodium: 142 mmol/L (ref 134–144)
Total Protein: 6.8 g/dL (ref 6.0–8.5)
eGFR: 116 mL/min/{1.73_m2} (ref >59)

## 2020-12-26 LAB — CBC WITH DIFFERENTIAL/PLATELET
Basophils Absolute: 0 10*3/uL (ref 0.0–0.2)
Basos: 0 %
EOS (ABSOLUTE): 0.1 10*3/uL (ref 0.0–0.4)
Eos: 1 %
Hematocrit: 41.2 % (ref 37.5–51.0)
Hemoglobin: 13.8 g/dL (ref 13.0–17.7)
Immature Grans (Abs): 0 10*3/uL (ref 0.0–0.1)
Immature Granulocytes: 0 %
Lymphocytes Absolute: 1.9 10*3/uL (ref 0.7–3.1)
Lymphs: 20 %
MCH: 29.1 pg (ref 26.6–33.0)
MCHC: 33.5 g/dL (ref 31.5–35.7)
MCV: 87 fL (ref 79–97)
Monocytes Absolute: 0.5 10*3/uL (ref 0.1–0.9)
Monocytes: 5 %
Neutrophils Absolute: 6.8 10*3/uL (ref 1.4–7.0)
Neutrophils: 74 %
Platelets: 218 10*3/uL (ref 150–450)
RBC: 4.74 x10E6/uL (ref 4.14–5.80)
RDW: 12.6 % (ref 11.6–15.4)
WBC: 9.4 10*3/uL (ref 3.4–10.8)

## 2020-12-26 LAB — CARDIOVASCULAR RISK ASSESSMENT

## 2020-12-26 LAB — HEMOGLOBIN A1C
Est. average glucose Bld gHb Est-mCnc: 146 mg/dL
Hgb A1c MFr Bld: 6.7 % — ABNORMAL HIGH (ref 4.8–5.6)

## 2020-12-26 NOTE — Progress Notes (Signed)
CBC normal, Glucose 130, kidney tests normal, liver tests normal, A1c 6.7 in diabetes range suggest GPL1, can help weight, Cholesterol normal lp

## 2020-12-27 NOTE — Progress Notes (Deleted)
Chronic Care Management Pharmacy Note  12/27/2020 Name:  Todd Rose MRN:  [id number] DOB:  [birth year]  Summary: ***  Recommendations/Changes made from today's visit: ***  Plan: ***   Subjective: Todd Rose is an 45 y.o. year old male who is a primary patient of Todd Rose, Todd Comfort, MD.  The CCM team was consulted for assistance with disease management and care coordination needs.    Engaged with patient face to face for initial visit in response to provider referral for pharmacy case management and/or care coordination services.   Consent to Services:  The patient was given the following information about Chronic Care Management services today, agreed to services, and gave verbal consent: 1. CCM service includes personalized support from designated clinical staff supervised by the primary care provider, including individualized plan of care and coordination with other care providers 2. 24/7 contact phone numbers for assistance for urgent and routine care needs. 3. Service will only be billed when office clinical staff spend 20 minutes or more in a month to coordinate care. 4. Only one practitioner may furnish and bill the service in a calendar month. 5.The patient may stop CCM services at any time (effective at the end of the month) by phone call to the office staff. 6. The patient will be responsible for cost sharing (co-pay) of up to 20% of the service fee (after annual deductible is met). Patient agreed to services and consent obtained.  Patient Care Team: Lillard Anes, MD as PCP - General (Family Medicine) Berniece Salines, DO as PCP - Cardiology (Cardiology) Practice, [facility] as Pharmacist (Pharmacist)  Recent office visits:  12/25/20- Todd Meeker, MD- seen for dizziness, started meclizine 25 mg tid prn, labs ordered, CT ordered, follow up 2 weeks   Recent consult visits:  10/31/20- Todd Po, PA-C ( Pain Medicine)- seen for  medication follow up and back pain, no medication changes, follow up 8-12 weeks 07/11/20- Todd Po, PA-C ( Pain Medicine)- seen for medication follow up and back pain, no medication changes, follow up 8-12 weeks   Hospital visits:  Medication Reconciliation was completed by comparing discharge summary, patient's EMR and Pharmacy list, and upon discussion with patient.   Same day discharge from [facility] Emergency Department on 11/01/20 due to flank plain   New?Medications Started at [facility] Discharge:?? Prochlorperazine maleate 10 mg q8h prn    All other medications  remain the same after Hospital Discharge:??    Objective:  Lab Results  Component Value Date   CREATININE 0.72 (L) 12/25/2020   BUN 12 12/25/2020   GFRNONAA >60 11/01/2020   GFRAA 118 08/05/2019   NA 142 12/25/2020   K 4.5 12/25/2020   CALCIUM 9.5 12/25/2020   CO2 23 12/25/2020   GLUCOSE 130 (H) 12/25/2020    Lab Results  Component Value Date/Time   HGBA1C 6.7 (H) 12/25/2020 10:10 AM   HGBA1C 7.1 (H) 08/05/2019 10:20 AM    Last diabetic Eye exam: No results found for: HMDIABEYEEXA  Last diabetic Foot exam: No results found for: HMDIABFOOTEX   Lab Results  Component Value Date   CHOL 152 12/25/2020   HDL 40 12/25/2020   LDLCALC 96 12/25/2020   LDLDIRECT 102 (H) 08/05/2019   TRIG 82 12/25/2020   CHOLHDL 3.8 12/25/2020    Hepatic Function Latest Ref Rng & Units 12/25/2020 11/01/2020 04/01/2020  Total Protein 6.0 - 8.5 g/dL 6.8 7.2 7.0  Albumin 4.0 - 5.0 g/dL 4.4 4.2 4.0  AST 0 - 40 IU/L 17 21 22   ALT 0 - 44 IU/L 22 22 36  Alk Phosphatase 44 - 121 IU/L 103 87 85  Total Bilirubin 0.0 - 1.2 mg/dL 0.4 1.9(H) 1.3(H)    No results found for: TSH, FREET4  CBC Latest Ref Rng & Units 12/25/2020 11/01/2020 04/01/2020  WBC 3.4 - 10.8 x10E3/uL 9.4 13.8(H) 11.2(H)  Hemoglobin 13.0 - 17.7 g/dL 13.8 14.7 13.9  Hematocrit 37.5 - 51.0 % 41.2 43.3 43.3  Platelets 150 - 450 x10E3/uL 218 246  268    No results found for: VD25OH  Clinical ASCVD: No  The 10-year ASCVD risk score Mikey Bussing DC Jr., et al., 2013) is: 6.5%   Values used to calculate the score:     Age: 31 years     Sex: Male     Is Non-Hispanic African American: No     Diabetic: Yes     Tobacco smoker: Yes     Systolic Blood Pressure: 601 mmHg     Is BP treated: Yes     HDL Cholesterol: 40 mg/dL     Total Cholesterol: 152 mg/dL    Depression screen [facility] 2/9 12/25/2020 05/25/2020 01/24/2020  Decreased Interest 0 3 3  Down, Depressed, Hopeless 0 3 3  PHQ - 2 Score 0 6 6  Altered sleeping - 3 3  Tired, decreased energy - 2 3  Change in appetite - 1 3  Feeling bad or failure about yourself  - 2 2  Trouble concentrating - 2 2  Moving slowly or fidgety/restless - 0 2  Suicidal thoughts - 0 0  PHQ-9 Score - 16 21  Difficult doing work/chores - - Extremely dIfficult     Other: (CHADS2VASc if Afib, MMRC or CAT for COPD, ACT, DEXA)  Social History   Tobacco Use  Smoking Status Former   Packs/day: 0.00   Types: Cigarettes   Quit date: 05/05/2020   Years since quitting: 0.6  Smokeless Tobacco Former   BP Readings from Last 3 Encounters:  12/25/20 110/88  11/01/20 (!) 187/120  05/25/20 110/80   Pulse Readings from Last 3 Encounters:  12/25/20 72  11/01/20 76  05/25/20 82   Wt Readings from Last 3 Encounters:  12/25/20 230 lb 9.6 oz (104.6 kg)  05/25/20 246 lb (111.6 kg)  04/13/20 251 lb (113.9 kg)   BMI Readings from Last 3 Encounters:  12/25/20 36.12 kg/m  05/25/20 38.53 kg/m  04/13/20 39.31 kg/m    Assessment/Interventions: Review of patient past medical history, allergies, medications, health status, including review of consultants reports, laboratory and other test data, was performed as part of comprehensive evaluation and provision of chronic care management services.   SDOH:  (Social Determinants of Health) assessments and interventions performed: Yes  SDOH Screenings   Alcohol  Screen: Low Risk    Last Alcohol Screening Score (AUDIT): 0  Depression (PHQ2-9): Low Risk    PHQ-2 Score: 0  Financial Resource Strain: Low Risk    Difficulty of Paying Living Expenses: Not hard at all  Food Insecurity: No Food Insecurity   Worried About Charity fundraiser in the Last Year: Never true   Ran Out of Food in the Last Year: Never true  Housing: Low Risk    Last Housing Risk Score: 0  Physical Activity: Insufficiently Active   Days of Exercise per Week: 2 days   Minutes of Exercise per Session: 30 min  Social Connections: Moderately Isolated   Frequency of Communication with  Friends and Family: Twice a week   Frequency of Social Gatherings with Friends and Family: Never   Attends Religious Services: More than 4 times per year   Active Member of [employer] or Organizations: No   Attends Archivist Meetings: Never   Marital Status: Married  Stress: Stress Concern Present   Feeling of Stress : To some extent  Tobacco Use: Medium Risk   Smoking Tobacco Use: Former   Smokeless Tobacco Use: Former  Transport planner Needs: No Data processing manager (Medical): No   Lack of Transportation (Non-Medical): No    CCM Care Plan  Allergies  Allergen Reactions   Naproxen    Tramadol Other (See Comments)    Nausea and headache    Medications Reviewed Today     Reviewed by Billie Lade, CMA (Certified Medical Assistant) on 12/25/20 at [id number]  Med List Status: <None>   Medication Order Taking? Sig Documenting Provider Last Dose Status Informant  amitriptyline (ELAVIL) 25 MG tablet [id number] Yes Take 25 mg by mouth at bedtime. [provider] Taking Active   gabapentin (NEURONTIN) 800 MG tablet [id number] Yes Take 1 tablet by mouth 4 times daily  Patient taking differently: Take 800 mg by mouth in the morning, at noon, in the evening, and at bedtime.   Lillard Anes, MD Taking Active   HYDROcodone-acetaminophen [facility]) 7.5-325 MG  tablet [id number] Yes Take 1 tablet by mouth every 6 (six) hours as needed for moderate pain. [provider] Taking Active Self  ibuprofen (ADVIL) 200 MG tablet [id number] Yes Take 200 mg by mouth every 6 (six) hours as needed for moderate pain.  [provider] Taking Active Self  lisinopril (ZESTRIL) 40 MG tablet [id number] Yes Take 1 tablet by mouth once daily Lillard Anes, MD Taking Active   metoCLOPramide (REGLAN) 10 MG tablet [id number] Yes Take 1 tablet (10 mg total) by mouth every 8 (eight) hours as needed for nausea (nausea/headache). Jacqlyn Larsen, PA-C Taking Active   metoprolol tartrate (LOPRESSOR) 100 MG tablet [id number]  Take 1 tablet (100 mg total) by mouth once for 1 dose. 2 hours before ct. Tobb, Kardie, DO  Expired 04/13/20 2359   omeprazole (PRILOSEC) 40 MG capsule [id number] Yes Take 40 mg by mouth 2 (two) times daily. [provider] Taking Active Self  pantoprazole (PROTONIX) 40 MG tablet [id number] Yes Take 40 mg by mouth daily. [provider] Taking Active Self  pravastatin (PRAVACHOL) 40 MG tablet [id number] Yes Take 1 tablet by mouth once daily Lillard Anes, MD Taking Active   prochlorperazine (COMPAZINE) 10 MG tablet [id number] Yes Take 1 tablet (10 mg total) by mouth every 8 (eight) hours as needed for nausea or vomiting. Dorie Rank, MD Taking Active             Patient Active Problem List   Diagnosis Date Noted   Vertigo 12/25/2020   Acute cerebrovascular insufficiency transient focal neurologic deficit 12/25/2020   Lumbar post-laminectomy syndrome 07/11/2020   Obstructive chronic bronchitis with exacerbation ([place]) 05/25/2020   Routine general medical examination at a health care facility 05/25/2020   Palpitations 04/13/2020   Chest discomfort 04/13/2020   Fatigue 04/13/2020   Daytime somnolence 04/13/2020   Snoring 04/13/2020   Chest pain of uncertain etiology 85/06/7739   Obesity (BMI 30-39.9) 04/13/2020    Depression 04/13/2020   Grief 04/13/2020   Shortness of breath 04/13/2020   Hypertension    Back pain    Morbid obesity (  [place]) 01/23/2020   Cigarette nicotine dependence without complication 93/26/7124   Chronic midline low back pain with right-sided sciatica 10/25/2019   Neural foraminal stenosis of lumbar spine 10/01/2019   Type II diabetes mellitus with peripheral circulatory disorder ([place]) 08/05/2019   Benign hypertension 08/05/2019   Bipolar 1 disorder ([place]) 08/05/2019   Mixed hyperlipidemia 58/01/9832   Nonalcoholic steatohepatitis ([place])    Gastroenteritis 06/21/2019   BPH with obstruction/lower urinary tract symptoms 06/21/2019   Pain medication agreement 05/27/2019   DDD (degenerative disc disease), lumbar 04/29/2019   Lumbar radiculopathy 04/29/2019   Spinal stenosis of lumbar region with neurogenic claudication 04/29/2019   Primary osteoarthritis of right knee 12/16/2013   S/P arthroscopy of knee 12/16/2013    Immunization History  Administered Date(s) Administered   Tdap 10/03/2018    Conditions to be addressed/monitored:  Hypertension, Hyperlipidemia, Diabetes, COPD, Osteoarthritis, Tobacco use, and back pain, NASH, Bipolar 1 disorder.   There are no care plans that you recently modified to display for this patient.    Medication Assistance: {MEDASSISTANCEINFO:25044}  Compliance/Adherence/Medication fill history: Care Gaps: Last annual wellness visit? 82/50/53 If applicable: Last eye exam / retinopathy screening? None noted Last diabetic foot exam?None noted  Star Rating Drugs:  pravastatin 40 MG- 90 DS last filled 10/31/20 lisinopril  40 MG- 90 DS last filled 12/07/20  Patient's preferred pharmacy is:  [facility], [place] [address] Phone: [phone] Fax: [phone]  [id number] [place] [place]Phone: [phone]: [phone] Uses pill box? {Yes or If no, why not?:20788} Pt endorses ***% compliance  We discussed: Benefits of medication synchronization, packaging and delivery as well as enhanced pharmacist oversight with [facility]. Patient decided to: [facility] and Follow Up Patient Decision:  Patient agrees to Care Plan and Follow-up.  Plan: Telephone follow up appointment with care management team member scheduled for:  ***  ***    Current Barriers:  {pharmacybarriers:24917}  Pharmacist Clinical Goal(s):  Patient will {PHARMACYGOALCHOICES:24921} through collaboration with PharmD and provider.   Interventions: 1:1 collaboration with PLillard Anes MD regarding development and update of comprehensive plan of care as evidenced by provider attestation and co-signature Inter-disciplinary care team collaboration (see longitudinal plan of care) Comprehensive medication review performed; medication list updated in electronic medical record  Hypertension (BP goal <130/80) -Uncontrolled -Current treatment: Lisinopril 40 mg daily  -Medications previously tried: ***  -Current home readings: *** -Current dietary habits: *** -Current exercise habits: *** -{ACTIONS;DENIES/REPORTS:21021675::"Denies"} hypotensive/hypertensive symptoms -Educated on {CCM BP Counseling:25124} -Counseled to monitor BP at home ***, document, and provide log at future appointments -{CCMPHARMDINTERVENTION:25122}  Hyperlipidemia: (LDL goal < ***) -Not ideally controlled -Current treatment: ***pravastatin 40 mg daily  -Medications previously tried: ***  -Current dietary patterns: *** -Current exercise habits: *** -Educated on {CCM HLD Counseling:25126} -{CCMPHARMDINTERVENTION:25122}  Diabetes (A1c goal <7%) -Controlled -Current medications: ***diet/lifestyle -Medications previously tried: *** metformin -Current  home glucose readings fasting glucose: *** post prandial glucose: *** -{ACTIONS;DENIES/REPORTS:21021675::"Denies"} hypoglycemic/hyperglycemic symptoms -Current meal patterns:  breakfast: ***  lunch: ***  dinner: *** snacks: *** [place]: *** -Current exercise: *** -Educated on {CCM DM COUNSELING:25123} -Counseled to check feet daily and get yearly eye exams -{CCMPHARMDINTERVENTION:25122}  COPD (Goal: control symptoms and prevent exacerbations) -{US controlled/uncontrolled:25276} -Current treatment  *** -Medications previously tried: ***  -Gold Grade: {CHL HP [facility]  COPD Gold CBULA:[id number]} -Current COPD Classification:  {CHL HP [facility] COPD Classification:[phone]} -MMRC/CAT score: *** -Pulmonary function testing: *** -Exacerbations requiring treatment in last 6 months: *** -Patient {Actions; denies-reports:120008} consistent use of maintenance inhaler -Frequency of rescue inhaler use: *** -Counseled on {CCMINHALERCOUNSELING:25121} -{CCMPHARMDINTERVENTION:25122}  Depression/Anxiety (Goal: ***) -{US controlled/uncontrolled:25276} -Current treatment: ***amitriptyline 25 mg daily ate bedtime -Medications previously tried/failed: ***Vraylar, Prozac, Prochlorperazine, Quetiapine, divalproex -PHQ9: *** -GAD7: *** -Connected with *** for mental health support -Educated on {CCM mental health counseling:25127} -{CCMPHARMDINTERVENTION:25122}  Tobacco use (Goal ***) -{US controlled/uncontrolled:25276} -Previous quit attempts: *** -Current treatment  *** -Patient smokes {Time to first cigarette:23873} -Patient triggers include: {Smoking Triggers:23882} -On a scale of 1-10, reports MOTIVATION to quit is *** -On a scale of 1-10, reports CONFIDENCE in quitting is *** -{Smoking Cessation Counseling:23883} -{CCMPHARMDINTERVENTION:25122}  *** (Goal: ***) -{US controlled/uncontrolled:25276} -Current treatment  Hydrocodone-acetaminophen 7.5-325 mg every 6 hours  prn  Ibuprofen 200 mg every 6 hours prn moderate pain  Gabapentin 800 mg am, noon, evening and bedtime -Medications previously tried: *** meloxicam, naproxen, methocarbamol -{CCMPHARMDINTERVENTION:25122}  *** (Goal: ***) -{US controlled/uncontrolled:25276} -Current treatment  ***omeprazole 40 mg bid -Medications previously tried: *** pantoprazole, sucralfate -{CCMPHARMDINTERVENTION:25122}   Health Maintenance -Vaccine gaps: *** -Current therapy:  *** -Educated on {ccm supplement counseling:25128} -{CCM Patient satisfied:25129} -{CCMPHARMDINTERVENTION:25122}  Patient Goals/Self-Care Activities Patient will:  - {pharmacypatientgoals:24919}  Follow Up Plan: {CM FOLLOW UP ZYYQ:82500}

## 2020-12-28 ENCOUNTER — Ambulatory Visit: Payer: Medicare Other

## 2021-01-05 ENCOUNTER — Telehealth: Payer: Self-pay

## 2021-01-05 NOTE — Telephone Encounter (Signed)
Gave letter to patient for being out of work.

## 2021-01-05 NOTE — Telephone Encounter (Signed)
Letter for patient to be out of work per Dr. Henrene Pastor.

## 2021-01-08 ENCOUNTER — Ambulatory Visit (INDEPENDENT_AMBULATORY_CARE_PROVIDER_SITE_OTHER): Payer: Medicare Other | Admitting: Legal Medicine

## 2021-01-08 ENCOUNTER — Other Ambulatory Visit: Payer: Self-pay

## 2021-01-08 ENCOUNTER — Encounter: Payer: Self-pay | Admitting: Legal Medicine

## 2021-01-08 VITALS — BP 140/82 | HR 82 | Temp 98.0°F | Ht 67.0 in | Wt 235.2 lb

## 2021-01-08 DIAGNOSIS — R42 Dizziness and giddiness: Secondary | ICD-10-CM | POA: Diagnosis not present

## 2021-01-08 MED ORDER — BUSPIRONE HCL 10 MG PO TABS: 10.0000 mg | ORAL_TABLET | Freq: Two times a day (BID) | ORAL | 3 refills | Status: DC

## 2021-01-08 NOTE — Progress Notes (Signed)
Established Patient Office Visit  Subjective:  Patient ID: Todd Rose, male    DOB: [birth year]  Age: 45 y.o. MRN: [id number]  CC:  Chief Complaint  Patient presents with   Dizziness     HPI Declin Todd Rose presents for vertigo. He improved and ten got worse.  No alcohol or drugs.  " Feels like he is drunk" fell x2, no injury.  He was out of work last week.  Worse when he sat up from bed.  Last < 1 minute.  No episodes since. He gets worse with stress.  Patient complains vehemently that his pain medicine is no longer controlling his pain from the pain clinic although he withdraws if he stops it.  I have recommended he discuss this with his pain specialist possibly long-acting medicines would be better for him or or buprenorphine .    Past Medical History:  Diagnosis Date   Back pain    Benign hypertension 08/05/2019   Bipolar 1 disorder ([place]) 08/05/2019   BPH with obstruction/lower urinary tract symptoms 06/21/2019   Chronic midline low back pain with right-sided sciatica 10/25/2019   Last Assessment & Plan:  Formatting of this note might be different from the original. This is a 45 year old gentleman with persistent and constant low back pain.  It has been going on for years but got worse over the last 2 years.  It goes down the side of his leg to his foot.  He is on disability for his back.  However he does have 5 children.  Some of those kids he has to lift up and take care    Cigarette nicotine dependence without complication 2/69/4854   DDD (degenerative disc disease), lumbar 04/29/2019   Last Assessment & Plan:  Very pleasant 45 year old overweight male with multilevel degenerative changes lumbar spine including degenerative disc disease, neural foraminal stenosis, ligamentum flavum hypertrophy and lumbar radiculopathy.  The patient underwent a right-sided L4-L5 lumbar epidural steroid injection in the clinic today without apparent complication.  He is a candidate for future injec    Diabetes mellitus without complication ([place])    Gastroenteritis 06/21/2019   Hypertension    Lumbar radiculopathy 04/29/2019   Last Assessment & Plan:  Continue Lyrica 150 mg b.i.d.   Mixed hyperlipidemia 08/05/2019   Morbid obesity ([place]) 01/23/2020   Neural foraminal stenosis of lumbar spine 11/06/348   Nonalcoholic steatohepatitis ([facility])    Pain medication agreement 05/27/2019   Last Assessment & Plan:  Formatting of this note might be different from the original. UDS completed today.  Review of prior UDS results are within normal limits.   Primary osteoarthritis of right knee 12/16/2013   S/P arthroscopy of knee 12/16/2013   Spinal stenosis of lumbar region with neurogenic claudication 04/29/2019   Last Assessment & Plan:  See degenerative disc plan.   Type II diabetes mellitus with peripheral circulatory disorder ([place]) 08/05/2019    Past Surgical History:  Procedure Laterality Date   BACK SURGERY  12/28/2019   CHOLECYSTECTOMY     KNEE SURGERY     SHOULDER SURGERY     left    Family History  Problem Relation Age of Onset   Cancer Mother    Heart attack Sister     Social History   Socioeconomic History   Marital status: Married    Spouse name: Not on file   Number of children: Not on file   Years of education: Not on file   Highest education level: Not on file  Occupational History   Not on file  Tobacco Use   Smoking status: Former    Packs/day: 0.00    Types: Cigarettes    Quit date: 05/05/2020    Years since quitting: 0.6   Smokeless tobacco: Former  Scientific laboratory technician Use: Never used  Substance and Sexual Activity   Alcohol use: Not Currently   Drug use: Not Currently   Sexual activity: Yes    Partners: Female  Other Topics Concern   Not on file  Social History Narrative   Not on file   Social Determinants of Health   Financial Resource Strain: Low Risk    Difficulty of Paying Living Expenses: Not hard at all  Food Insecurity: No Food Insecurity    Worried About Charity fundraiser in the Last Year: Never true   Arboriculturist in the Last Year: Never true  Transportation Needs: No Transportation Needs   Lack of Transportation (Medical): No   Lack of Transportation (Non-Medical): No  Physical Activity: Insufficiently Active   Days of Exercise per Week: 2 days   Minutes of Exercise per Session: 30 min  Stress: Stress Concern Present   Feeling of Stress : To some extent  Social Connections: Moderately Isolated   Frequency of Communication with Friends and Family: Twice a week   Frequency of Social Gatherings with Friends and Family: Never   Attends Religious Services: More than 4 times per year   Active Member of [employer] or Organizations: No   Attends Music therapist: Never   Marital Status: Married  Human resources officer Violence: Not At Risk   Fear of Current or Ex-Partner: No   Emotionally Abused: No   Physically Abused: No   Sexually Abused: No    Outpatient Medications Prior to Visit  Medication Sig Dispense Refill   amitriptyline (ELAVIL) 25 MG tablet Take 25 mg by mouth at bedtime.     gabapentin (NEURONTIN) 800 MG tablet Take 1 tablet by mouth 4 times daily (Patient taking differently: Take 800 mg by mouth in the morning, at noon, in the evening, and at bedtime.) 120 tablet 4   HYDROcodone-acetaminophen (NORCO) 7.5-325 MG tablet Take 1 tablet by mouth every 6 (six) hours as needed for moderate pain.     ibuprofen (ADVIL) 200 MG tablet Take 200 mg by mouth every 6 (six) hours as needed for moderate pain.      lisinopril (ZESTRIL) 40 MG tablet Take 1 tablet by mouth once daily 90 tablet 2   meclizine (ANTIVERT) 25 MG tablet Take 1 tablet (25 mg total) by mouth 3 (three) times daily as needed for dizziness. 30 tablet 0   metoprolol tartrate (LOPRESSOR) 100 MG tablet Take 1 tablet (100 mg total) by mouth once for 1 dose. 2 hours before ct. 1 tablet 0   omeprazole (PRILOSEC) 40 MG capsule Take 40 mg by mouth 2 (two)  times daily.     pravastatin (PRAVACHOL) 40 MG tablet Take 1 tablet by mouth once daily 90 tablet 2   No facility-administered medications prior to visit.    Allergies  Allergen Reactions   Naproxen    Tramadol Other (See Comments)    Nausea and headache    ROS Review of Systems  Constitutional:  Negative for chills, fatigue and fever.  HENT:  Negative for congestion, ear pain and sore throat.   Respiratory:  Negative for cough and shortness of breath.   Cardiovascular:  Negative for chest pain.  Gastrointestinal:  Negative for abdominal pain, constipation, diarrhea, nausea and vomiting.  Endocrine: Negative for polydipsia, polyphagia and polyuria.  Genitourinary:  Negative for dysuria and frequency.  Musculoskeletal:  Negative for arthralgias and myalgias.  Neurological:  Negative for dizziness and headaches.  Psychiatric/Behavioral:  Negative for dysphoric mood.        No dysphoria     Objective:    Physical Exam Vitals reviewed.  Constitutional:      General: He is not in acute distress.    Appearance: Normal appearance.  HENT:     Right Ear: Tympanic membrane, ear canal and external ear normal.     Left Ear: Tympanic membrane, ear canal and external ear normal.     Mouth/Throat:     Mouth: Mucous membranes are moist.     Pharynx: Oropharynx is clear.  Eyes:     Extraocular Movements: Extraocular movements intact.     Conjunctiva/sclera: Conjunctivae normal.     Pupils: Pupils are equal, round, and reactive to light.  Cardiovascular:     Rate and Rhythm: Normal rate and regular rhythm.     Pulses: Normal pulses.     Heart sounds: Normal heart sounds. No murmur heard.   No gallop.  Pulmonary:     Effort: Pulmonary effort is normal. No respiratory distress.     Breath sounds: Normal breath sounds. No wheezing.  Abdominal:     General: Abdomen is flat. Bowel sounds are normal. There is no distension.     Palpations: Abdomen is soft.     Tenderness: There is no  abdominal tenderness.  Musculoskeletal:        General: Normal range of motion.     Cervical back: Normal range of motion.  Skin:    General: Skin is warm.     Capillary Refill: Capillary refill takes less than 2 seconds.  Neurological:     General: No focal deficit present.     Mental Status: He is alert and oriented to person, place, and time. Mental status is at baseline.    BP 140/82   Pulse 82   Temp 98 F (36.7 C)   Ht _0  (1.702 m)   Wt 235 lb 3.2 oz (106.7 kg)   SpO2 96%   BMI 36.84 kg/m  Wt Readings from Last 3 Encounters:  01/08/21 235 lb 3.2 oz (106.7 kg)  12/25/20 230 lb 9.6 oz (104.6 kg)  05/25/20 246 lb (111.6 kg)     Health Maintenance Due  Topic Date Due   PNEUMOCOCCAL POLYSACCHARIDE VACCINE AGE 58-64 HIGH RISK  Never done   COVID-19 Vaccine (1) Never done   Pneumococcal Vaccine 53-75 Years old (1 - PCV) Never done   FOOT EXAM  Never done   OPHTHALMOLOGY EXAM  Never done   Hepatitis C Screening  Never done   INFLUENZA VACCINE  12/11/2020    There are no preventive care reminders to display for this patient.  No results found for: TSH Lab Results  Component Value Date   WBC 9.4 12/25/2020   HGB 13.8 12/25/2020   HCT 41.2 12/25/2020   MCV 87 12/25/2020   PLT 218 12/25/2020   Lab Results  Component Value Date   NA 142 12/25/2020   K 4.5 12/25/2020   CO2 23 12/25/2020   GLUCOSE 130 (H) 12/25/2020   BUN 12 12/25/2020   CREATININE 0.72 (L) 12/25/2020   BILITOT 0.4 12/25/2020   ALKPHOS 103 12/25/2020   AST 17 12/25/2020   ALT  22 12/25/2020   PROT 6.8 12/25/2020   ALBUMIN 4.4 12/25/2020   CALCIUM 9.5 12/25/2020   ANIONGAP 10 11/01/2020   EGFR 116 12/25/2020   Lab Results  Component Value Date   CHOL 152 12/25/2020   Lab Results  Component Value Date   HDL 40 12/25/2020   Lab Results  Component Value Date   LDLCALC 96 12/25/2020   Lab Results  Component Value Date   TRIG 82 12/25/2020   Lab Results  Component Value Date    CHOLHDL 3.8 12/25/2020   Lab Results  Component Value Date   HGBA1C 6.7 (H) 12/25/2020      Assessment & Plan:   Problem List Items Addressed This Visit       Other   Vertigo - Primary   Relevant Medications   busPIRone (BUSPAR) 10 MG tablet   Other Relevant Orders   Ambulatory referral to Neurology  patient given letter to [facility].  We will refer him to neurology for workup.  He is to discuss pain with his chronic pin maniger  Meds ordered this encounter  Medications   busPIRone (BUSPAR) 10 MG tablet    Sig: Take 1 tablet (10 mg total) by mouth 2 (two) times daily.    Dispense:  60 tablet    Refill:  3     Follow-up: Return in about 2 weeks (around 01/22/2021) for vertigo.    Reinaldo Meeker, MD

## 2021-01-09 ENCOUNTER — Telehealth: Payer: Self-pay

## 2021-01-09 NOTE — Telephone Encounter (Signed)
   Patient ID: Todd Rose, male    DOB: 08-27-1975  Age: 45 y.o. MRN: [id number]  01/09/21 1:39 PM  Caller Name: Jenny Reichmann [place] Relationship to Patient: self  Can be reached at: [phone]  Current Outpatient Medications on File Prior to Visit  Medication Sig Dispense Refill   amitriptyline (ELAVIL) 25 MG tablet Take 25 mg by mouth at bedtime.     busPIRone (BUSPAR) 10 MG tablet Take 1 tablet (10 mg total) by mouth 2 (two) times daily. 60 tablet 3   gabapentin (NEURONTIN) 800 MG tablet Take 1 tablet by mouth 4 times daily (Patient taking differently: Take 800 mg by mouth in the morning, at noon, in the evening, and at bedtime.) 120 tablet 4   HYDROcodone-acetaminophen (NORCO) 7.5-325 MG tablet Take 1 tablet by mouth every 6 (six) hours as needed for moderate pain.     ibuprofen (ADVIL) 200 MG tablet Take 200 mg by mouth every 6 (six) hours as needed for moderate pain.      lisinopril (ZESTRIL) 40 MG tablet Take 1 tablet by mouth once daily 90 tablet 2   meclizine (ANTIVERT) 25 MG tablet Take 1 tablet (25 mg total) by mouth 3 (three) times daily as needed for dizziness. 30 tablet 0   metoprolol tartrate (LOPRESSOR) 100 MG tablet Take 1 tablet (100 mg total) by mouth once for 1 dose. 2 hours before ct. 1 tablet 0   omeprazole (PRILOSEC) 40 MG capsule Take 40 mg by mouth 2 (two) times daily.     pravastatin (PRAVACHOL) 40 MG tablet Take 1 tablet by mouth once daily 90 tablet 2   No current facility-administered medications on file prior to visit.    Todd Rose called today and was triaged by: Alonna Minium, in regard to:  Complaint: Vertigo/Falls Pt states he has fallen x3 since office visit yesterday. Denies head injury, loss of consciousness.   Aggravating Factors: Bending over, was tying shoes. Feels as if someone pushes him over.    Advice Given: Will send note to provider for advise as pt was seen yesterday in office.   Royce Macadamia, [place] 01/09/21 1:57 PM

## 2021-01-09 NOTE — Telephone Encounter (Signed)
Made pt aware. Pt will await call for appointment.   Todd Rose, [place] 01/09/21 4:25 PM

## 2021-01-09 NOTE — Telephone Encounter (Signed)
Need referral to neurology lp

## 2021-01-18 ENCOUNTER — Encounter: Payer: Self-pay | Admitting: Neurology

## 2021-01-22 ENCOUNTER — Ambulatory Visit: Payer: Medicare Other | Admitting: Legal Medicine

## 2021-01-30 DIAGNOSIS — Z760 Encounter for issue of repeat prescription: Secondary | ICD-10-CM | POA: Diagnosis not present

## 2021-01-30 DIAGNOSIS — G8929 Other chronic pain: Secondary | ICD-10-CM | POA: Diagnosis not present

## 2021-01-30 DIAGNOSIS — M961 Postlaminectomy syndrome, not elsewhere classified: Secondary | ICD-10-CM | POA: Diagnosis not present

## 2021-01-30 DIAGNOSIS — Z79899 Other long term (current) drug therapy: Secondary | ICD-10-CM | POA: Diagnosis not present

## 2021-01-30 DIAGNOSIS — M5116 Intervertebral disc disorders with radiculopathy, lumbar region: Secondary | ICD-10-CM | POA: Diagnosis not present

## 2021-01-30 DIAGNOSIS — Z79891 Long term (current) use of opiate analgesic: Secondary | ICD-10-CM | POA: Diagnosis not present

## 2021-02-15 DIAGNOSIS — R1900 Intra-abdominal and pelvic swelling, mass and lump, unspecified site: Secondary | ICD-10-CM | POA: Diagnosis not present

## 2021-02-15 DIAGNOSIS — K6289 Other specified diseases of anus and rectum: Secondary | ICD-10-CM | POA: Diagnosis not present

## 2021-02-15 DIAGNOSIS — M7989 Other specified soft tissue disorders: Secondary | ICD-10-CM | POA: Diagnosis not present

## 2021-02-27 DIAGNOSIS — F1190 Opioid use, unspecified, uncomplicated: Secondary | ICD-10-CM | POA: Insufficient documentation

## 2021-02-27 DIAGNOSIS — M961 Postlaminectomy syndrome, not elsewhere classified: Secondary | ICD-10-CM | POA: Diagnosis not present

## 2021-02-27 DIAGNOSIS — Z79891 Long term (current) use of opiate analgesic: Secondary | ICD-10-CM | POA: Diagnosis not present

## 2021-02-27 DIAGNOSIS — Z79899 Other long term (current) drug therapy: Secondary | ICD-10-CM | POA: Diagnosis not present

## 2021-02-27 DIAGNOSIS — G8929 Other chronic pain: Secondary | ICD-10-CM | POA: Diagnosis not present

## 2021-02-27 DIAGNOSIS — Z760 Encounter for issue of repeat prescription: Secondary | ICD-10-CM | POA: Diagnosis not present

## 2021-02-27 DIAGNOSIS — M5116 Intervertebral disc disorders with radiculopathy, lumbar region: Secondary | ICD-10-CM | POA: Diagnosis not present

## 2021-03-07 ENCOUNTER — Other Ambulatory Visit: Payer: Self-pay

## 2021-03-07 ENCOUNTER — Encounter: Payer: Self-pay | Admitting: Legal Medicine

## 2021-03-07 ENCOUNTER — Ambulatory Visit (INDEPENDENT_AMBULATORY_CARE_PROVIDER_SITE_OTHER): Payer: Medicare Other | Admitting: Legal Medicine

## 2021-03-07 VITALS — BP 110/80 | HR 92 | Temp 98.2°F | Resp 16 | Ht 67.0 in | Wt 257.0 lb

## 2021-03-07 DIAGNOSIS — J0100 Acute maxillary sinusitis, unspecified: Secondary | ICD-10-CM | POA: Diagnosis not present

## 2021-03-07 MED ORDER — PREDNISONE 10 MG (21) PO TBPK: ORAL_TABLET | ORAL | 0 refills | Status: DC

## 2021-03-07 MED ORDER — AMOXICILLIN-POT CLAVULANATE 875-125 MG PO TABS: 1.0000 | ORAL_TABLET | Freq: Two times a day (BID) | ORAL | 0 refills | Status: DC

## 2021-03-07 NOTE — Progress Notes (Signed)
w  Acute Office Visit  Subjective:    Patient ID: Todd Rose, male    DOB: [birth year], 45 y.o.   MRN: [id number]  Chief Complaint  Patient presents with   Ear Fullness   Ear Pain    HPI: Patient is in today for right ear pain and right ear fullness. He also has nasal congestion on the right nostril. He denied fever, sore throat. For one week.  No fever or chills.  Past Medical History:  Diagnosis Date   Back pain    Benign hypertension 08/05/2019   Bipolar 1 disorder ([place]) 08/05/2019   BPH with obstruction/lower urinary tract symptoms 06/21/2019   Chronic midline low back pain with right-sided sciatica 10/25/2019   Last Assessment & Plan:  Formatting of this note might be different from the original. This is a 44 year old gentleman with persistent and constant low back pain.  It has been going on for years but got worse over the last 2 years.  It goes down the side of his leg to his foot.  He is on disability for his back.  However he does have 5 children.  Some of those kids he has to lift up and take care    Cigarette nicotine dependence without complication 0/34/7425   DDD (degenerative disc disease), lumbar 04/29/2019   Last Assessment & Plan:  Very pleasant 45 year old overweight male with multilevel degenerative changes lumbar spine including degenerative disc disease, neural foraminal stenosis, ligamentum flavum hypertrophy and lumbar radiculopathy.  The patient underwent a right-sided L4-L5 lumbar epidural steroid injection in the clinic today without apparent complication.  He is a candidate for future injec   Diabetes mellitus without complication ([place])    Gastroenteritis 06/21/2019   Hypertension    Lumbar radiculopathy 04/29/2019   Last Assessment & Plan:  Continue Lyrica 150 mg b.i.d.   Mixed hyperlipidemia 08/05/2019   Morbid obesity ([place]) 01/23/2020   Neural foraminal stenosis of lumbar spine 9/56/3875   Nonalcoholic steatohepatitis ([facility])    Pain medication agreement  05/27/2019   Last Assessment & Plan:  Formatting of this note might be different from the original. UDS completed today.  Review of prior UDS results are within normal limits.   Primary osteoarthritis of right knee 12/16/2013   S/P arthroscopy of knee 12/16/2013   Spinal stenosis of lumbar region with neurogenic claudication 04/29/2019   Last Assessment & Plan:  See degenerative disc plan.   Type II diabetes mellitus with peripheral circulatory disorder ([place]) 08/05/2019    Past Surgical History:  Procedure Laterality Date   BACK SURGERY  12/28/2019   CHOLECYSTECTOMY     KNEE SURGERY     SHOULDER SURGERY     left    Family History  Problem Relation Age of Onset   Cancer Mother    Heart attack Sister     Social History   Socioeconomic History   Marital status: Married    Spouse name: Not on file   Number of children: Not on file   Years of education: Not on file   Highest education level: Not on file  Occupational History   Not on file  Tobacco Use   Smoking status: Every Day    Packs/day: 0.20    Types: Cigarettes    Last attempt to quit: 05/05/2020    Years since quitting: 0.8   Smokeless tobacco: Former  Scientific laboratory technician Use: Never used  Substance and Sexual Activity   Alcohol use: Not Currently  Drug use: Not Currently   Sexual activity: Yes    Partners: Female  Other Topics Concern   Not on file  Social History Narrative   Not on file   Social Determinants of Health   Financial Resource Strain: Low Risk    Difficulty of Paying Living Expenses: Not hard at all  Food Insecurity: No Food Insecurity   Worried About Charity fundraiser in the Last Year: Never true   [place] in the Last Year: Never true  Transportation Needs: No Transportation Needs   Lack of Transportation (Medical): No   Lack of Transportation (Non-Medical): No  Physical Activity: Insufficiently Active   Days of Exercise per Week: 2 days   Minutes of Exercise per Session: 30 min   Stress: Stress Concern Present   Feeling of Stress : To some extent  Social Connections: Moderately Isolated   Frequency of Communication with Friends and Family: Twice a week   Frequency of Social Gatherings with Friends and Family: Never   Attends Religious Services: More than 4 times per year   Active Member of [employer] or Organizations: No   Attends Music therapist: Never   Marital Status: Married  Human resources officer Violence: Not At Risk   Fear of Current or Ex-Partner: No   Emotionally Abused: No   Physically Abused: No   Sexually Abused: No    Outpatient Medications Prior to Visit  Medication Sig Dispense Refill   buprenorphine (BUTRANS) 20 MCG/HR [place] onto the skin.     busPIRone (BUSPAR) 10 MG tablet Take 1 tablet (10 mg total) by mouth 2 (two) times daily. 60 tablet 3   gabapentin (NEURONTIN) 800 MG tablet Take 1 tablet by mouth 4 times daily (Patient taking differently: Take 800 mg by mouth in the morning, at noon, in the evening, and at bedtime.) 120 tablet 4   HYDROcodone-acetaminophen (NORCO) 7.5-325 MG tablet Take 1 tablet by mouth 2 (two) times daily.     ibuprofen (ADVIL) 200 MG tablet Take 200 mg by mouth every 6 (six) hours as needed for moderate pain.      lisinopril (ZESTRIL) 40 MG tablet Take 1 tablet by mouth once daily 90 tablet 2   meclizine (ANTIVERT) 25 MG tablet Take 1 tablet (25 mg total) by mouth 3 (three) times daily as needed for dizziness. 30 tablet 0   naloxone (NARCAN) nasal spray 4 mg/0.1 mL Place into the nose.     omeprazole (PRILOSEC) 40 MG capsule Take 40 mg by mouth 2 (two) times daily.     pravastatin (PRAVACHOL) 40 MG tablet Take 1 tablet by mouth once daily 90 tablet 2   metoprolol tartrate (LOPRESSOR) 100 MG tablet Take 1 tablet (100 mg total) by mouth once for 1 dose. 2 hours before ct. 1 tablet 0   amitriptyline (ELAVIL) 25 MG tablet Take 25 mg by mouth at bedtime.     No facility-administered medications prior to visit.     Allergies  Allergen Reactions   Naproxen    Tramadol Other (See Comments)    Nausea and headache    Review of Systems  Constitutional:  Negative for chills, fatigue and fever.  HENT:  Positive for ear pain, hearing loss and sinus pain. Negative for congestion and sore throat.   Respiratory:  Negative for cough and shortness of breath.   Cardiovascular:  Negative for chest pain.  Musculoskeletal:  Negative for myalgias.  Neurological:  Positive for headaches.  Objective:    Physical Exam Vitals reviewed.  Constitutional:      General: He is in acute distress.     Appearance: Normal appearance.  HENT:     Left Ear: Tympanic membrane, ear canal and external ear normal.     Ears:     Comments: Right tm budging and painful    Mouth/Throat:     Mouth: Mucous membranes are moist.     Comments: Right maxillary sinus tender Eyes:     Extraocular Movements: Extraocular movements intact.     Conjunctiva/sclera: Conjunctivae normal.     Pupils: Pupils are equal, round, and reactive to light.  Cardiovascular:     Rate and Rhythm: Normal rate and regular rhythm.     Pulses: Normal pulses.     Heart sounds: Normal heart sounds. No murmur heard.   No gallop.  Pulmonary:     Effort: Pulmonary effort is normal. No respiratory distress.     Breath sounds: Normal breath sounds. No wheezing.  Abdominal:     General: Abdomen is flat. Bowel sounds are normal. There is no distension.     Palpations: Abdomen is soft.     Tenderness: There is no abdominal tenderness.  Musculoskeletal:        General: Normal range of motion.  Skin:    General: Skin is warm.     Capillary Refill: Capillary refill takes less than 2 seconds.  Neurological:     General: No focal deficit present.     Mental Status: He is alert and oriented to person, place, and time. Mental status is at baseline.    BP 110/80   Pulse 92   Temp 98.2 F (36.8 C)   Resp 16   Ht 5' 7"  (1.702 m)   Wt 257 lb (116.6  kg)   SpO2 98%   BMI 40.25 kg/m  Wt Readings from Last 3 Encounters:  03/07/21 257 lb (116.6 kg)  01/08/21 235 lb 3.2 oz (106.7 kg)  12/25/20 230 lb 9.6 oz (104.6 kg)    Health Maintenance Due  Topic Date Due   FOOT EXAM  Never done   OPHTHALMOLOGY EXAM  Never done   Hepatitis C Screening  Never done    There are no preventive care reminders to display for this patient.   No results found for: TSH Lab Results  Component Value Date   WBC 9.4 12/25/2020   HGB 13.8 12/25/2020   HCT 41.2 12/25/2020   MCV 87 12/25/2020   PLT 218 12/25/2020   Lab Results  Component Value Date   NA 142 12/25/2020   K 4.5 12/25/2020   CO2 23 12/25/2020   GLUCOSE 130 (H) 12/25/2020   BUN 12 12/25/2020   CREATININE 0.72 (L) 12/25/2020   BILITOT 0.4 12/25/2020   ALKPHOS 103 12/25/2020   AST 17 12/25/2020   ALT 22 12/25/2020   PROT 6.8 12/25/2020   ALBUMIN 4.4 12/25/2020   CALCIUM 9.5 12/25/2020   ANIONGAP 10 11/01/2020   EGFR 116 12/25/2020   Lab Results  Component Value Date   CHOL 152 12/25/2020   Lab Results  Component Value Date   HDL 40 12/25/2020   Lab Results  Component Value Date   LDLCALC 96 12/25/2020   Lab Results  Component Value Date   TRIG 82 12/25/2020   Lab Results  Component Value Date   CHOLHDL 3.8 12/25/2020   Lab Results  Component Value Date   HGBA1C 6.7 (H) 12/25/2020  Assessment & Plan:   Problem List Items Addressed This Visit   None Visit Diagnoses     Acute non-recurrent maxillary sinusitis    -  Primary   Relevant Medications   amoxicillin-clavulanate (AUGMENTIN) 875-125 MG tablet   predniSONE (STERAPRED UNI-PAK 21 TAB) 10 MG (21) TBPK tablet  Treat sinusitis and otitis with augmentin    Meds ordered this encounter  Medications   amoxicillin-clavulanate (AUGMENTIN) 875-125 MG tablet    Sig: Take 1 tablet by mouth 2 (two) times daily.    Dispense:  20 tablet    Refill:  0   predniSONE (STERAPRED UNI-PAK 21 TAB) 10 MG (21)  TBPK tablet    Sig: Take 6ills first day , then 5 pills day 2 and then cut down one pill day until gone    Dispense:  21 tablet    Refill:  0         Follow-up: Return if symptoms worsen or fail to improve.  An After Visit Summary was printed and given to the patient.  Reinaldo Meeker, MD [facility] [phone]

## 2021-03-27 DIAGNOSIS — M961 Postlaminectomy syndrome, not elsewhere classified: Secondary | ICD-10-CM | POA: Diagnosis not present

## 2021-03-27 DIAGNOSIS — Z5181 Encounter for therapeutic drug level monitoring: Secondary | ICD-10-CM | POA: Diagnosis not present

## 2021-03-27 DIAGNOSIS — G8929 Other chronic pain: Secondary | ICD-10-CM | POA: Diagnosis not present

## 2021-03-27 DIAGNOSIS — M48061 Spinal stenosis, lumbar region without neurogenic claudication: Secondary | ICD-10-CM | POA: Diagnosis not present

## 2021-03-27 DIAGNOSIS — M5116 Intervertebral disc disorders with radiculopathy, lumbar region: Secondary | ICD-10-CM | POA: Diagnosis not present

## 2021-03-27 DIAGNOSIS — Z79891 Long term (current) use of opiate analgesic: Secondary | ICD-10-CM | POA: Diagnosis not present

## 2021-03-27 DIAGNOSIS — M25561 Pain in right knee: Secondary | ICD-10-CM | POA: Diagnosis not present

## 2021-03-27 DIAGNOSIS — Z760 Encounter for issue of repeat prescription: Secondary | ICD-10-CM | POA: Diagnosis not present

## 2021-04-15 NOTE — Progress Notes (Deleted)
NEUROLOGY CONSULTATION NOTE  Todd Rose MRN: [id number] DOB: 16-Jan-1976  Referring provider: Reinaldo Meeker, MD Primary care provider: Reinaldo Meeker, MD  Reason for consult:  vertigo  Assessment/Plan:   ***   Subjective:  Todd Rose is a 45 year old male with DDD lumbar spine with chronic low back pain, DM II, HTN, Bipolar 1 disorder and NASH who presents for vertigo.  History supplemented by referring provider's note.  ***  CT head on 12/25/2020 was normal.       PAST MEDICAL HISTORY: Past Medical History:  Diagnosis Date   Back pain    Benign hypertension 08/05/2019   Bipolar 1 disorder ([place]) 08/05/2019   BPH with obstruction/lower urinary tract symptoms 06/21/2019   Chronic midline low back pain with right-sided sciatica 10/25/2019   Last Assessment & Plan:  Formatting of this note might be different from the original. This is a 45 year old gentleman with persistent and constant low back pain.  It has been going on for years but got worse over the last 2 years.  It goes down the side of his leg to his foot.  He is on disability for his back.  However he does have 5 children.  Some of those kids he has to lift up and take care    Cigarette nicotine dependence without complication 7/67/2094   DDD (degenerative disc disease), lumbar 04/29/2019   Last Assessment & Plan:  Very pleasant 45 year old overweight male with multilevel degenerative changes lumbar spine including degenerative disc disease, neural foraminal stenosis, ligamentum flavum hypertrophy and lumbar radiculopathy.  The patient underwent a right-sided L4-L5 lumbar epidural steroid injection in the clinic today without apparent complication.  He is a candidate for future injec   Diabetes mellitus without complication ([place])    Gastroenteritis 06/21/2019   Hypertension    Lumbar radiculopathy 04/29/2019   Last Assessment & Plan:  Continue Lyrica 150 mg b.i.d.   Mixed hyperlipidemia 08/05/2019   Morbid obesity ([place])  01/23/2020   Neural foraminal stenosis of lumbar spine 11/18/6281   Nonalcoholic steatohepatitis ([facility])    Pain medication agreement 05/27/2019   Last Assessment & Plan:  Formatting of this note might be different from the original. UDS completed today.  Review of prior UDS results are within normal limits.   Primary osteoarthritis of right knee 12/16/2013   S/P arthroscopy of knee 12/16/2013   Spinal stenosis of lumbar region with neurogenic claudication 04/29/2019   Last Assessment & Plan:  See degenerative disc plan.   Type II diabetes mellitus with peripheral circulatory disorder ([place]) 08/05/2019    PAST SURGICAL HISTORY: Past Surgical History:  Procedure Laterality Date   BACK SURGERY  12/28/2019   CHOLECYSTECTOMY     KNEE SURGERY     SHOULDER SURGERY     left    MEDICATIONS: Current Outpatient Medications on File Prior to Visit  Medication Sig Dispense Refill   amoxicillin-clavulanate (AUGMENTIN) 875-125 MG tablet Take 1 tablet by mouth 2 (two) times daily. 20 tablet 0   busPIRone (BUSPAR) 10 MG tablet Take 1 tablet (10 mg total) by mouth 2 (two) times daily. 60 tablet 3   gabapentin (NEURONTIN) 800 MG tablet Take 1 tablet by mouth 4 times daily (Patient taking differently: Take 800 mg by mouth in the morning, at noon, in the evening, and at bedtime.) 120 tablet 4   HYDROcodone-acetaminophen (NORCO) 7.5-325 MG tablet Take 1 tablet by mouth 2 (two) times daily.     ibuprofen (ADVIL) 200 MG tablet Take 200  mg by mouth every 6 (six) hours as needed for moderate pain.      lisinopril (ZESTRIL) 40 MG tablet Take 1 tablet by mouth once daily 90 tablet 2   meclizine (ANTIVERT) 25 MG tablet Take 1 tablet (25 mg total) by mouth 3 (three) times daily as needed for dizziness. 30 tablet 0   metoprolol tartrate (LOPRESSOR) 100 MG tablet Take 1 tablet (100 mg total) by mouth once for 1 dose. 2 hours before ct. 1 tablet 0   naloxone (NARCAN) nasal spray 4 mg/0.1 mL Place into the nose.      omeprazole (PRILOSEC) 40 MG capsule Take 40 mg by mouth 2 (two) times daily.     pravastatin (PRAVACHOL) 40 MG tablet Take 1 tablet by mouth once daily 90 tablet 2   predniSONE (STERAPRED UNI-PAK 21 TAB) 10 MG (21) TBPK tablet Take 6ills first day , then 5 pills day 2 and then cut down one pill day until gone 21 tablet 0   No current facility-administered medications on file prior to visit.    ALLERGIES: Allergies  Allergen Reactions   Naproxen    Tramadol Other (See Comments)    Nausea and headache    FAMILY HISTORY: Family History  Problem Relation Age of Onset   Cancer Mother    Heart attack Sister     Objective:  *** General: No acute distress.  Patient appears well-groomed.   Head:  Normocephalic/atraumatic Eyes:  fundi examined but not visualized Neck: supple, no paraspinal tenderness, full range of motion Back: No paraspinal tenderness Heart: regular rate and rhythm Lungs: Clear to auscultation bilaterally. Vascular: No carotid bruits. Neurological Exam: Mental status: alert and oriented to person, place, and time, recent and remote memory intact, fund of knowledge intact, attention and concentration intact, speech fluent and not dysarthric, language intact. Cranial nerves: CN I: not tested CN II: pupils equal, round and reactive to light, visual fields intact CN III, IV, VI:  full range of motion, no nystagmus, no ptosis CN V: facial sensation intact. CN VII: upper and lower face symmetric CN VIII: hearing intact CN IX, X: gag intact, uvula midline CN XI: sternocleidomastoid and trapezius muscles intact CN XII: tongue midline Bulk & Tone: normal, no fasciculations. Motor:  muscle strength 5/5 throughout Sensation:  Pinprick, temperature and vibratory sensation intact. Deep Tendon Reflexes:  2+ throughout,  toes downgoing.   Finger to nose testing:  Without dysmetria.   Heel to shin:  Without dysmetria.   Gait:  Normal station and stride.  Romberg  negative.    Thank you for allowing me to take part in the care of this patient.  Metta Clines, DO  CC: Reinaldo Meeker, MD

## 2021-04-16 ENCOUNTER — Ambulatory Visit: Payer: Medicare Other | Admitting: Neurology

## 2021-05-10 ENCOUNTER — Ambulatory Visit (INDEPENDENT_AMBULATORY_CARE_PROVIDER_SITE_OTHER): Payer: Medicare Other | Admitting: Legal Medicine

## 2021-05-10 ENCOUNTER — Other Ambulatory Visit: Payer: Self-pay

## 2021-05-10 ENCOUNTER — Encounter: Payer: Self-pay | Admitting: Legal Medicine

## 2021-05-10 VITALS — BP 136/78 | HR 107 | Ht 67.0 in | Wt 258.0 lb

## 2021-05-10 DIAGNOSIS — S161XXA Strain of muscle, fascia and tendon at neck level, initial encounter: Secondary | ICD-10-CM | POA: Diagnosis not present

## 2021-05-10 DIAGNOSIS — M1711 Unilateral primary osteoarthritis, right knee: Secondary | ICD-10-CM | POA: Diagnosis not present

## 2021-05-10 DIAGNOSIS — J0100 Acute maxillary sinusitis, unspecified: Secondary | ICD-10-CM

## 2021-05-10 DIAGNOSIS — J329 Chronic sinusitis, unspecified: Secondary | ICD-10-CM | POA: Insufficient documentation

## 2021-05-10 MED ORDER — AZITHROMYCIN 250 MG PO TABS: ORAL_TABLET | ORAL | 0 refills | Status: AC

## 2021-05-10 MED ORDER — CYCLOBENZAPRINE HCL 10 MG PO TABS: 10.0000 mg | ORAL_TABLET | Freq: Three times a day (TID) | ORAL | 0 refills | Status: DC | PRN

## 2021-05-10 NOTE — Patient Instructions (Signed)
Neck Exercises Ask your health care provider which exercises are safe for you. Do exercises exactly as told by your health care provider and adjust them as directed. It is normal to feel mild stretching, pulling, tightness, or discomfort as you do these exercises. Stop right away if you feel sudden pain or your pain gets worse. Do not begin these exercises until told by your health care provider. Neck exercises can be important for many reasons. They can improve strength and maintain flexibility in your neck, which will help your upper back and prevent neck pain. Stretching exercises Rotation neck stretching  Sit in a chair or stand up. Place your feet flat on the floor, shoulder-width apart. Slowly turn your head (rotate) to the right until a slight stretch is felt. Turn it all the way to the right so you can look over your right shoulder. Do not tilt or tip your head. Hold this position for 10-30 seconds. Slowly turn your head (rotate) to the left until a slight stretch is felt. Turn it all the way to the left so you can look over your left shoulder. Do not tilt or tip your head. Hold this position for 10-30 seconds. Repeat __________ times. Complete this exercise __________ times a day. Neck retraction  Sit in a sturdy chair or stand up. Look straight ahead. Do not bend your neck. Use your fingers to push your chin backward (retraction). Do not bend your neck for this movement. Continue to face straight ahead. If you are doing the exercise properly, you will feel a slight sensation in your throat and a stretch at the back of your neck. Hold the stretch for 1-2 seconds. Repeat __________ times. Complete this exercise __________ times a day. Strengthening exercises Neck press  Lie on your back on a firm bed or on the floor with a pillow under your head. Use your neck muscles to push your head down on the pillow and straighten your spine. Hold the position as well as you can. Keep your head  facing up (in a neutral position) and your chin tucked. Slowly count to 5 while holding this position. Repeat __________ times. Complete this exercise __________ times a day. Isometrics These are exercises in which you strengthen the muscles in your neck while keeping your neck still (isometrics). Sit in a supportive chair and place your hand on your forehead. Keep your head and face facing straight ahead. Do not flex or extend your neck while doing isometrics. Push forward with your head and neck while pushing back with your hand. Hold for 10 seconds. Do the sequence again, this time putting your hand against the back of your head. Use your head and neck to push backward against the hand pressure. Finally, do the same exercise on either side of your head, pushing sideways against the pressure of your hand. Repeat __________ times. Complete this exercise __________ times a day. Prone head lifts  Lie face-down (prone position), resting on your elbows so that your chest and upper back are raised. Start with your head facing downward, near your chest. Position your chin either on or near your chest. Slowly lift your head upward. Lift until you are looking straight ahead. Then continue lifting your head as far back as you can comfortably stretch. Hold your head up for 5 seconds. Then slowly lower it to your starting position. Repeat __________ times. Complete this exercise __________ times a day. Supine head lifts  Lie on your back (supine position), bending your knees  to point to the ceiling and keeping your feet flat on the floor. Lift your head slowly off the floor, raising your chin toward your chest. Hold for 5 seconds. Repeat __________ times. Complete this exercise __________ times a day. Scapular retraction  Stand with your arms at your sides. Look straight ahead. Slowly pull both shoulders (scapulae) backward and downward (retraction) until you feel a stretch between your shoulder  blades in your upper back. Hold for 10-30 seconds. Relax and repeat. Repeat __________ times. Complete this exercise __________ times a day. Contact a health care provider if: Your neck pain or discomfort gets worse when you do an exercise. Your neck pain or discomfort does not improve within 2 hours after you exercise. If you have any of these problems, stop exercising right away. Do not do the exercises again unless your health care provider says that you can. Get help right away if: You develop sudden, severe neck pain. If this happens, stop exercising right away. Do not do the exercises again unless your health care provider says that you can. This information is not intended to replace advice given to you by your health care provider. Make sure you discuss any questions you have with your health care provider. Document Revised: 10/24/2020 Document Reviewed: 10/24/2020 Elsevier Patient Education  [place].

## 2021-05-10 NOTE — Progress Notes (Signed)
Established Patient Office Visit  Subjective:  Patient ID: Todd Rose, male    DOB: 1975-07-04  Age: 45 y.o. MRN: [id number]  CC:  Chief Complaint  Patient presents with   Neck Pain    Right side. Started on the 23rd, decreased ROM. Has taken his regular pain medication which has not helped much.    HPI Todd Rose presents for pain on scalp on right side started 05/05/2021.  No changes. Pain on turning to left.  Negative lhermitte  Past Medical History:  Diagnosis Date   Back pain    Benign hypertension 08/05/2019   Bipolar 1 disorder ([place]) 08/05/2019   BPH with obstruction/lower urinary tract symptoms 06/21/2019   Chronic midline low back pain with right-sided sciatica 10/25/2019   Last Assessment & Plan:  Formatting of this note might be different from the original. This is a 45 year old gentleman with persistent and constant low back pain.  It has been going on for years but got worse over the last 2 years.  It goes down the side of his leg to his foot.  He is on disability for his back.  However he does have 5 children.  Some of those kids he has to lift up and take care    Cigarette nicotine dependence without complication 8/33/8250   DDD (degenerative disc disease), lumbar 04/29/2019   Last Assessment & Plan:  Very pleasant 45 year old overweight male with multilevel degenerative changes lumbar spine including degenerative disc disease, neural foraminal stenosis, ligamentum flavum hypertrophy and lumbar radiculopathy.  The patient underwent a right-sided L4-L5 lumbar epidural steroid injection in the clinic today without apparent complication.  He is a candidate for future injec   Diabetes mellitus without complication ([place])    Gastroenteritis 06/21/2019   Hypertension    Lumbar radiculopathy 04/29/2019   Last Assessment & Plan:  Continue Lyrica 150 mg b.i.d.   Mixed hyperlipidemia 08/05/2019   Morbid obesity ([place]) 01/23/2020   Neural foraminal stenosis of lumbar spine 5/39/7673    Nonalcoholic steatohepatitis ([facility])    Pain medication agreement 05/27/2019   Last Assessment & Plan:  Formatting of this note might be different from the original. UDS completed today.  Review of prior UDS results are within normal limits.   Primary osteoarthritis of right knee 12/16/2013   S/P arthroscopy of knee 12/16/2013   Spinal stenosis of lumbar region with neurogenic claudication 04/29/2019   Last Assessment & Plan:  See degenerative disc plan.   Type II diabetes mellitus with peripheral circulatory disorder ([place]) 08/05/2019    Past Surgical History:  Procedure Laterality Date   BACK SURGERY  12/28/2019   CHOLECYSTECTOMY     KNEE SURGERY     SHOULDER SURGERY     left    Family History  Problem Relation Age of Onset   Cancer Mother    Heart attack Sister     Social History   Socioeconomic History   Marital status: Married    Spouse name: Not on file   Number of children: Not on file   Years of education: Not on file   Highest education level: Not on file  Occupational History   Not on file  Tobacco Use   Smoking status: Every Day    Packs/day: 0.20    Types: Cigarettes    Last attempt to quit: 05/05/2020    Years since quitting: 1.0   Smokeless tobacco: Former  Scientific laboratory technician Use: Never used  Substance and Sexual Activity  Alcohol use: Not Currently   Drug use: Not Currently   Sexual activity: Yes    Partners: Female  Other Topics Concern   Not on file  Social History Narrative   Not on file   Social Determinants of Health   Financial Resource Strain: Low Risk    Difficulty of Paying Living Expenses: Not hard at all  Food Insecurity: No Food Insecurity   Worried About Charity fundraiser in the Last Year: Never true   [place] in the Last Year: Never true  Transportation Needs: No Transportation Needs   Lack of Transportation (Medical): No   Lack of Transportation (Non-Medical): No  Physical Activity: Insufficiently Active   Days of  Exercise per Week: 2 days   Minutes of Exercise per Session: 30 min  Stress: Stress Concern Present   Feeling of Stress : To some extent  Social Connections: Moderately Isolated   Frequency of Communication with Friends and Family: Twice a week   Frequency of Social Gatherings with Friends and Family: Never   Attends Religious Services: More than 4 times per year   Active Member of [employer] or Organizations: No   Attends Music therapist: Never   Marital Status: Married  Human resources officer Violence: Not At Risk   Fear of Current or Ex-Partner: No   Emotionally Abused: No   Physically Abused: No   Sexually Abused: No    Outpatient Medications Prior to Visit  Medication Sig Dispense Refill   busPIRone (BUSPAR) 10 MG tablet Take 1 tablet (10 mg total) by mouth 2 (two) times daily. 60 tablet 3   gabapentin (NEURONTIN) 800 MG tablet Take 1 tablet by mouth 4 times daily (Patient taking differently: Take 800 mg by mouth in the morning, at noon, in the evening, and at bedtime.) 120 tablet 4   HYDROcodone-acetaminophen (NORCO) 7.5-325 MG tablet Take 1 tablet by mouth 2 (two) times daily.     ibuprofen (ADVIL) 200 MG tablet Take 200 mg by mouth every 6 (six) hours as needed for moderate pain.      lisinopril (ZESTRIL) 40 MG tablet Take 1 tablet by mouth once daily 90 tablet 2   meclizine (ANTIVERT) 25 MG tablet Take 1 tablet (25 mg total) by mouth 3 (three) times daily as needed for dizziness. 30 tablet 0   metoprolol tartrate (LOPRESSOR) 100 MG tablet Take 1 tablet (100 mg total) by mouth once for 1 dose. 2 hours before ct. 1 tablet 0   naloxone (NARCAN) nasal spray 4 mg/0.1 mL Place into the nose.     omeprazole (PRILOSEC) 40 MG capsule Take 40 mg by mouth 2 (two) times daily.     pravastatin (PRAVACHOL) 40 MG tablet Take 1 tablet by mouth once daily 90 tablet 2   amoxicillin-clavulanate (AUGMENTIN) 875-125 MG tablet Take 1 tablet by mouth 2 (two) times daily. 20 tablet 0   predniSONE  (STERAPRED UNI-PAK 21 TAB) 10 MG (21) TBPK tablet Take 6ills first day , then 5 pills day 2 and then cut down one pill day until gone 21 tablet 0   No facility-administered medications prior to visit.    Allergies  Allergen Reactions   Naproxen    Tramadol Other (See Comments)    Nausea and headache    ROS Review of Systems  Constitutional:  Negative for activity change and appetite change.  HENT:  Negative for congestion.   Respiratory:  Negative for chest tightness and shortness of breath.  Cardiovascular:  Negative for chest pain, palpitations and leg swelling.  Gastrointestinal:  Negative for abdominal distention and abdominal pain.  Genitourinary: Negative.   Musculoskeletal:  Negative for arthralgias.  Neurological: Negative.   Psychiatric/Behavioral: Negative.       Objective:    Physical Exam Vitals reviewed.  Constitutional:      Appearance: Normal appearance.  HENT:     Right Ear: Tympanic membrane normal.     Left Ear: Tympanic membrane normal.     Nose: Congestion present.     Mouth/Throat:     Mouth: Mucous membranes are moist.     Pharynx: Oropharynx is clear.     Comments: Maxillary sinus pain and congestion Eyes:     Extraocular Movements: Extraocular movements intact.     Conjunctiva/sclera: Conjunctivae normal.     Pupils: Pupils are equal, round, and reactive to light.  Neck:     Comments: Pain on movement of neck to left.  Negative Lhermitte.  No neurologic weakness Cardiovascular:     Rate and Rhythm: Normal rate and regular rhythm.     Pulses: Normal pulses.     Heart sounds: Normal heart sounds. No murmur heard.   No gallop.  Pulmonary:     Effort: Pulmonary effort is normal. No respiratory distress.     Breath sounds: Normal breath sounds. No wheezing.  Abdominal:     General: Abdomen is flat. Bowel sounds are normal. There is no distension.     Palpations: Abdomen is soft.  Musculoskeletal:        General: Normal range of motion.      Cervical back: Normal range of motion. Tenderness present. No rigidity.     Comments: Right knee pain and crepitation of ROM  Skin:    General: Skin is warm.     Capillary Refill: Capillary refill takes less than 2 seconds.  Neurological:     General: No focal deficit present.     Mental Status: He is alert and oriented to person, place, and time. Mental status is at baseline.    BP 136/78    Pulse (!) 107    Ht _0  (1.702 m)    Wt 258 lb (117 kg)    SpO2 97%    BMI 40.41 kg/m  Wt Readings from Last 3 Encounters:  05/10/21 258 lb (117 kg)  03/07/21 257 lb (116.6 kg)  01/08/21 235 lb 3.2 oz (106.7 kg)     Health Maintenance Due  Topic Date Due   FOOT EXAM  Never done   OPHTHALMOLOGY EXAM  Never done   Hepatitis C Screening  Never done    There are no preventive care reminders to display for this patient.  No results found for: TSH Lab Results  Component Value Date   WBC 9.4 12/25/2020   HGB 13.8 12/25/2020   HCT 41.2 12/25/2020   MCV 87 12/25/2020   PLT 218 12/25/2020   Lab Results  Component Value Date   NA 142 12/25/2020   K 4.5 12/25/2020   CO2 23 12/25/2020   GLUCOSE 130 (H) 12/25/2020   BUN 12 12/25/2020   CREATININE 0.72 (L) 12/25/2020   BILITOT 0.4 12/25/2020   ALKPHOS 103 12/25/2020   AST 17 12/25/2020   ALT 22 12/25/2020   PROT 6.8 12/25/2020   ALBUMIN 4.4 12/25/2020   CALCIUM 9.5 12/25/2020   ANIONGAP 10 11/01/2020   EGFR 116 12/25/2020   Lab Results  Component Value Date   CHOL 152 12/25/2020  Lab Results  Component Value Date   HDL 40 12/25/2020   Lab Results  Component Value Date   LDLCALC 96 12/25/2020   Lab Results  Component Value Date   TRIG 82 12/25/2020   Lab Results  Component Value Date   CHOLHDL 3.8 12/25/2020   Lab Results  Component Value Date   HGBA1C 6.7 (H) 12/25/2020      Assessment & Plan:   Diagnoses and all orders for this visit: Primary osteoarthritis of right knee -     AMB referral to  orthopedics Knee pain with OA, refer  Acute strain of neck muscle, initial encounter -     cyclobenzaprine (FLEXERIL) 10 MG tablet; Take 1 tablet (10 mg total) by mouth 3 (three) times daily as needed for muscle spasms. Neck has muscle strain, ROM exercises and muscle relaxer  Acute non-recurrent maxillary sinusitis -     azithromycin (ZITHROMAX) 250 MG tablet; Take 2 tablets on day 1, then 1 tablet daily on days 2 through 5       Follow-up: Return in about 2 weeks (around 05/24/2021) for neck.    Reinaldo Meeker, MD

## 2021-05-21 ENCOUNTER — Other Ambulatory Visit: Payer: Self-pay

## 2021-05-21 DIAGNOSIS — S161XXA Strain of muscle, fascia and tendon at neck level, initial encounter: Secondary | ICD-10-CM

## 2021-05-21 DIAGNOSIS — R42 Dizziness and giddiness: Secondary | ICD-10-CM

## 2021-05-21 MED ORDER — CYCLOBENZAPRINE HCL 10 MG PO TABS: 10.0000 mg | ORAL_TABLET | Freq: Three times a day (TID) | ORAL | 0 refills | Status: DC | PRN

## 2021-05-21 MED ORDER — BUSPIRONE HCL 10 MG PO TABS: 10.0000 mg | ORAL_TABLET | Freq: Two times a day (BID) | ORAL | 3 refills | Status: AC

## 2021-05-24 ENCOUNTER — Ambulatory Visit: Payer: Medicare Other | Admitting: Legal Medicine

## 2021-06-04 ENCOUNTER — Other Ambulatory Visit: Payer: Self-pay

## 2021-06-04 DIAGNOSIS — S161XXA Strain of muscle, fascia and tendon at neck level, initial encounter: Secondary | ICD-10-CM

## 2021-06-04 MED ORDER — CYCLOBENZAPRINE HCL 10 MG PO TABS: 10.0000 mg | ORAL_TABLET | Freq: Three times a day (TID) | ORAL | 1 refills | Status: AC | PRN

## 2021-06-04 MED ORDER — GABAPENTIN 800 MG PO TABS: 800.0000 mg | ORAL_TABLET | Freq: Four times a day (QID) | ORAL | 3 refills | Status: AC

## 2021-06-07 ENCOUNTER — Ambulatory Visit (INDEPENDENT_AMBULATORY_CARE_PROVIDER_SITE_OTHER): Payer: Medicare Other | Admitting: Legal Medicine

## 2021-06-07 ENCOUNTER — Other Ambulatory Visit: Payer: Self-pay

## 2021-06-07 ENCOUNTER — Encounter: Payer: Self-pay | Admitting: Legal Medicine

## 2021-06-07 VITALS — BP 140/100 | HR 96 | Temp 98.7°F | Resp 15 | Ht 67.0 in | Wt 259.0 lb

## 2021-06-07 DIAGNOSIS — M5030 Other cervical disc degeneration, unspecified cervical region: Secondary | ICD-10-CM | POA: Diagnosis not present

## 2021-06-07 DIAGNOSIS — S161XXD Strain of muscle, fascia and tendon at neck level, subsequent encounter: Secondary | ICD-10-CM

## 2021-06-07 DIAGNOSIS — M48061 Spinal stenosis, lumbar region without neurogenic claudication: Secondary | ICD-10-CM

## 2021-06-07 DIAGNOSIS — M5136 Other intervertebral disc degeneration, lumbar region: Secondary | ICD-10-CM | POA: Diagnosis not present

## 2021-06-07 DIAGNOSIS — M2578 Osteophyte, vertebrae: Secondary | ICD-10-CM | POA: Diagnosis not present

## 2021-06-07 NOTE — Progress Notes (Signed)
Acute Office Visit  Subjective:    Patient ID: Todd Rose, male    DOB: Sep 10, 1975, 46 y.o.   MRN: [id number]  Chief Complaint  Patient presents with   Neck Pain   Anxiety    HPI: Patient is in today for neck pain. He mentioned that he cannot move his neck to left side too much, but he can move it to right better. If he hold his neck to right, it hurts.  Patient said that his brother passed away the last 31-Jul-2022. They did not know what happened.  Past Medical History:  Diagnosis Date   Back pain    Benign hypertension 08/05/2019   Bipolar 1 disorder ([place]) 08/05/2019   BPH with obstruction/lower urinary tract symptoms 06/21/2019   Chronic midline low back pain with right-sided sciatica 10/25/2019   Last Assessment & Plan:  Formatting of this note might be different from the original. This is a 46 year old gentleman with persistent and constant low back pain.  It has been going on for years but got worse over the last 2 years.  It goes down the side of his leg to his foot.  He is on disability for his back.  However he does have 5 children.  Some of those kids he has to lift up and take care    Cigarette nicotine dependence without complication 08/24/2438   DDD (degenerative disc disease), lumbar 04/29/2019   Last Assessment & Plan:  Very pleasant 46 year old overweight male with multilevel degenerative changes lumbar spine including degenerative disc disease, neural foraminal stenosis, ligamentum flavum hypertrophy and lumbar radiculopathy.  The patient underwent a right-sided L4-L5 lumbar epidural steroid injection in the clinic today without apparent complication.  He is a candidate for future injec   Diabetes mellitus without complication ([place])    Gastroenteritis 06/21/2019   Hypertension    Lumbar radiculopathy 04/29/2019   Last Assessment & Plan:  Continue Lyrica 150 mg b.i.d.   Mixed hyperlipidemia 08/05/2019   Morbid obesity ([place]) 01/23/2020   Neural foraminal stenosis of lumbar  spine 05/15/7251   Nonalcoholic steatohepatitis ([place])    Pain medication agreement 05/27/2019   Last Assessment & Plan:  Formatting of this note might be different from the original. UDS completed today.  Review of prior UDS results are within normal limits.   Primary osteoarthritis of right knee 12/16/2013   S/P arthroscopy of knee 12/16/2013   Spinal stenosis of lumbar region with neurogenic claudication 04/29/2019   Last Assessment & Plan:  See degenerative disc plan.   Type II diabetes mellitus with peripheral circulatory disorder ([place]) 08/05/2019    Past Surgical History:  Procedure Laterality Date   BACK SURGERY  12/28/2019   CHOLECYSTECTOMY     KNEE SURGERY     SHOULDER SURGERY     left    Family History  Problem Relation Age of Onset   Cancer Mother    Heart attack Sister     Social History   Socioeconomic History   Marital status: Married    Spouse name: Not on file   Number of children: Not on file   Years of education: Not on file   Highest education level: Not on file  Occupational History   Not on file  Tobacco Use   Smoking status: Every Day    Packs/day: 0.20    Types: Cigarettes    Last attempt to quit: 05/05/2020    Years since quitting: 1.0   Smokeless tobacco: Former  Media pl[employer]  Vaping Use: Never used  Substance and Sexual Activity   Alcohol use: Not Currently   Drug use: Not Currently   Sexual activity: Yes    Partners: Female  Other Topics Concern   Not on file  Social History Narrative   Not on file   Social Determinants of Health   Financial Resource Strain: Not on file  Food Insecurity: Not on file  Transportation Needs: Not on file  Physical Activity: Not on file  Stress: Not on file  Social Connections: Not on file  Intimate Partner Violence: Not on file    Outpatient Medications Prior to Visit  Medication Sig Dispense Refill   busPIRone (BUSPAR) 10 MG tablet Take 1 tablet (10 mg total) by mouth 2 (two) times daily. 60 tablet 3    cyclobenzaprine (FLEXERIL) 10 MG tablet Take 1 tablet (10 mg total) by mouth 3 (three) times daily as needed for muscle spasms. 30 tablet 1   gabapentin (NEURONTIN) 800 MG tablet Take 1 tablet (800 mg total) by mouth in the morning, at noon, in the evening, and at bedtime. 120 tablet 3   HYDROcodone-acetaminophen (NORCO/VICODIN) 5-325 MG tablet Take by mouth.     ibuprofen (ADVIL) 200 MG tablet Take 200 mg by mouth every 6 (six) hours as needed for moderate pain.      lisinopril (ZESTRIL) 40 MG tablet Take 1 tablet by mouth once daily 90 tablet 2   meclizine (ANTIVERT) 25 MG tablet Take 1 tablet (25 mg total) by mouth 3 (three) times daily as needed for dizziness. 30 tablet 0   naloxone (NARCAN) nasal spray 4 mg/0.1 mL Place into the nose.     omeprazole (PRILOSEC) 40 MG capsule Take 40 mg by mouth 2 (two) times daily.     pravastatin (PRAVACHOL) 40 MG tablet Take 1 tablet by mouth once daily 90 tablet 2   metoprolol tartrate (LOPRESSOR) 100 MG tablet Take 1 tablet (100 mg total) by mouth once for 1 dose. 2 hours before ct. 1 tablet 0   HYDROcodone-acetaminophen (NORCO) 7.5-325 MG tablet Take 1 tablet by mouth 2 (two) times daily.     No facility-administered medications prior to visit.    Allergies  Allergen Reactions   Naproxen    Tramadol Other (See Comments)    Nausea and headache    Review of Systems  Constitutional:  Negative for activity change and appetite change.  HENT:  Negative for congestion.   Eyes:  Negative for visual disturbance.  Respiratory:  Negative for cough, choking and chest tightness.   Gastrointestinal:  Negative for abdominal distention and abdominal pain.  Endocrine: Negative.   Genitourinary: Negative.   Musculoskeletal:  Positive for arthralgias and back pain.  Skin: Negative.   Neurological: Negative.   Psychiatric/Behavioral: Negative.        Objective:    Physical Exam Vitals reviewed.  Constitutional:      General: He is not in acute  distress.    Appearance: He is obese.  HENT:     Right Ear: Tympanic membrane normal.     Left Ear: Tympanic membrane normal.     Nose: Nose normal.     Mouth/Throat:     Mouth: Mucous membranes are moist.  Eyes:     Conjunctiva/sclera: Conjunctivae normal.     Pupils: Pupils are equal, round, and reactive to light.  Neck:     Comments: Neck extension 20 degrees, flexion 40 degrees, rotation 45 degrees. Negative lhermitte's sign. Cardiovascular:  Rate and Rhythm: Normal rate and regular rhythm.     Pulses: Normal pulses.     Heart sounds: Normal heart sounds.  Pulmonary:     Effort: Pulmonary effort is normal.     Breath sounds: Normal breath sounds.  Abdominal:     General: Bowel sounds are normal.     Palpations: Abdomen is soft. There is no mass.     Tenderness: There is no abdominal tenderness.  Musculoskeletal:        General: Tenderness present.     Cervical back: Tenderness present.  Skin:    General: Skin is warm.     Capillary Refill: Capillary refill takes less than 2 seconds.  Neurological:     Mental Status: He is alert and oriented to person, place, and time. Mental status is at baseline.  Psychiatric:        Mood and Affect: Mood normal.        Behavior: Behavior normal.        Thought Content: Thought content normal.    BP (!) 140/100    Pulse 96    Temp 98.7 F (37.1 C)    Resp 15    Ht 5' 7"  (1.702 m)    Wt 259 lb (117.5 kg)    SpO2 97%    BMI 40.57 kg/m  Wt Readings from Last 3 Encounters:  06/07/21 259 lb (117.5 kg)  05/10/21 258 lb (117 kg)  03/07/21 257 lb (116.6 kg)    Health Maintenance Due  Topic Date Due   FOOT EXAM  Never done   OPHTHALMOLOGY EXAM  Never done   Hepatitis C Screening  Never done    There are no preventive care reminders to display for this patient.   No results found for: TSH Lab Results  Component Value Date   WBC 9.4 12/25/2020   HGB 13.8 12/25/2020   HCT 41.2 12/25/2020   MCV 87 12/25/2020   PLT 218  12/25/2020   Lab Results  Component Value Date   NA 142 12/25/2020   K 4.5 12/25/2020   CO2 23 12/25/2020   GLUCOSE 130 (H) 12/25/2020   BUN 12 12/25/2020   CREATININE 0.72 (L) 12/25/2020   BILITOT 0.4 12/25/2020   ALKPHOS 103 12/25/2020   AST 17 12/25/2020   ALT 22 12/25/2020   PROT 6.8 12/25/2020   ALBUMIN 4.4 12/25/2020   CALCIUM 9.5 12/25/2020   ANIONGAP 10 11/01/2020   EGFR 116 12/25/2020   Lab Results  Component Value Date   CHOL 152 12/25/2020   Lab Results  Component Value Date   HDL 40 12/25/2020   Lab Results  Component Value Date   LDLCALC 96 12/25/2020   Lab Results  Component Value Date   TRIG 82 12/25/2020   Lab Results  Component Value Date   CHOLHDL 3.8 12/25/2020   Lab Results  Component Value Date   HGBA1C 6.7 (H) 12/25/2020       Assessment & Plan:   Problem List Items Addressed This Visit       Musculoskeletal and Integument   Cervical strain, acute - Primary   Get cervical spine x-ray, refer to another pain clinic     Follow-up: Return in about 3 weeks (around 06/28/2021) for neck pain.  An After Visit Summary was printed and given to the patient.  Reinaldo Meeker, MD [facility] [phone]

## 2021-06-11 ENCOUNTER — Other Ambulatory Visit: Payer: Self-pay

## 2021-06-11 DIAGNOSIS — M5030 Other cervical disc degeneration, unspecified cervical region: Secondary | ICD-10-CM

## 2021-06-12 ENCOUNTER — Other Ambulatory Visit: Payer: Self-pay

## 2021-06-12 DIAGNOSIS — S161XXD Strain of muscle, fascia and tendon at neck level, subsequent encounter: Secondary | ICD-10-CM

## 2021-06-12 DIAGNOSIS — Z760 Encounter for issue of repeat prescription: Secondary | ICD-10-CM | POA: Diagnosis not present

## 2021-06-12 DIAGNOSIS — Z79899 Other long term (current) drug therapy: Secondary | ICD-10-CM | POA: Diagnosis not present

## 2021-06-12 DIAGNOSIS — F17210 Nicotine dependence, cigarettes, uncomplicated: Secondary | ICD-10-CM | POA: Diagnosis not present

## 2021-06-12 DIAGNOSIS — M25561 Pain in right knee: Secondary | ICD-10-CM | POA: Diagnosis not present

## 2021-06-12 DIAGNOSIS — M961 Postlaminectomy syndrome, not elsewhere classified: Secondary | ICD-10-CM | POA: Diagnosis not present

## 2021-06-12 DIAGNOSIS — M5416 Radiculopathy, lumbar region: Secondary | ICD-10-CM | POA: Diagnosis not present

## 2021-06-12 DIAGNOSIS — Z5181 Encounter for therapeutic drug level monitoring: Secondary | ICD-10-CM | POA: Diagnosis not present

## 2021-06-12 DIAGNOSIS — Z79891 Long term (current) use of opiate analgesic: Secondary | ICD-10-CM | POA: Diagnosis not present

## 2021-06-12 DIAGNOSIS — G8929 Other chronic pain: Secondary | ICD-10-CM | POA: Diagnosis not present

## 2021-06-12 DIAGNOSIS — M48062 Spinal stenosis, lumbar region with neurogenic claudication: Secondary | ICD-10-CM | POA: Diagnosis not present

## 2021-06-12 DIAGNOSIS — Z599 Problem related to housing and economic circumstances, unspecified: Secondary | ICD-10-CM | POA: Diagnosis not present

## 2021-06-12 DIAGNOSIS — R030 Elevated blood-pressure reading, without diagnosis of hypertension: Secondary | ICD-10-CM | POA: Diagnosis not present

## 2021-06-15 DIAGNOSIS — Z79899 Other long term (current) drug therapy: Secondary | ICD-10-CM | POA: Diagnosis not present

## 2021-06-15 DIAGNOSIS — Z131 Encounter for screening for diabetes mellitus: Secondary | ICD-10-CM | POA: Diagnosis not present

## 2021-06-15 DIAGNOSIS — I10 Essential (primary) hypertension: Secondary | ICD-10-CM | POA: Diagnosis not present

## 2021-06-15 DIAGNOSIS — M545 Low back pain, unspecified: Secondary | ICD-10-CM | POA: Diagnosis not present

## 2021-06-15 DIAGNOSIS — M25561 Pain in right knee: Secondary | ICD-10-CM | POA: Diagnosis not present

## 2021-06-15 DIAGNOSIS — R5383 Other fatigue: Secondary | ICD-10-CM | POA: Diagnosis not present

## 2021-06-15 DIAGNOSIS — E559 Vitamin D deficiency, unspecified: Secondary | ICD-10-CM | POA: Diagnosis not present

## 2021-06-15 DIAGNOSIS — M129 Arthropathy, unspecified: Secondary | ICD-10-CM | POA: Diagnosis not present

## 2021-06-15 DIAGNOSIS — Z1159 Encounter for screening for other viral diseases: Secondary | ICD-10-CM | POA: Diagnosis not present

## 2021-06-15 DIAGNOSIS — G8929 Other chronic pain: Secondary | ICD-10-CM | POA: Diagnosis not present

## 2021-06-15 DIAGNOSIS — M25562 Pain in left knee: Secondary | ICD-10-CM | POA: Diagnosis not present

## 2021-06-15 DIAGNOSIS — Z9181 History of falling: Secondary | ICD-10-CM | POA: Diagnosis not present

## 2021-06-18 DIAGNOSIS — M542 Cervicalgia: Secondary | ICD-10-CM | POA: Diagnosis not present

## 2021-06-25 DIAGNOSIS — M542 Cervicalgia: Secondary | ICD-10-CM | POA: Diagnosis not present

## 2021-06-27 DIAGNOSIS — H81399 Other peripheral vertigo, unspecified ear: Secondary | ICD-10-CM | POA: Diagnosis not present

## 2021-06-27 DIAGNOSIS — J0100 Acute maxillary sinusitis, unspecified: Secondary | ICD-10-CM | POA: Diagnosis not present

## 2021-06-29 DIAGNOSIS — M545 Low back pain, unspecified: Secondary | ICD-10-CM | POA: Diagnosis not present

## 2021-06-29 DIAGNOSIS — M25562 Pain in left knee: Secondary | ICD-10-CM | POA: Diagnosis not present

## 2021-06-29 DIAGNOSIS — G8929 Other chronic pain: Secondary | ICD-10-CM | POA: Diagnosis not present

## 2021-06-29 DIAGNOSIS — Z79899 Other long term (current) drug therapy: Secondary | ICD-10-CM | POA: Diagnosis not present

## 2021-06-29 DIAGNOSIS — Z9181 History of falling: Secondary | ICD-10-CM | POA: Diagnosis not present

## 2021-06-29 DIAGNOSIS — M25561 Pain in right knee: Secondary | ICD-10-CM | POA: Diagnosis not present

## 2021-06-29 DIAGNOSIS — I10 Essential (primary) hypertension: Secondary | ICD-10-CM | POA: Diagnosis not present

## 2021-07-03 ENCOUNTER — Other Ambulatory Visit: Payer: Medicare Other

## 2021-07-05 DIAGNOSIS — Z79899 Other long term (current) drug therapy: Secondary | ICD-10-CM | POA: Diagnosis not present

## 2021-07-09 DIAGNOSIS — M542 Cervicalgia: Secondary | ICD-10-CM | POA: Diagnosis not present

## 2021-07-16 DIAGNOSIS — I10 Essential (primary) hypertension: Secondary | ICD-10-CM | POA: Diagnosis not present

## 2021-07-16 DIAGNOSIS — F17210 Nicotine dependence, cigarettes, uncomplicated: Secondary | ICD-10-CM | POA: Diagnosis not present

## 2021-07-16 DIAGNOSIS — I8393 Asymptomatic varicose veins of bilateral lower extremities: Secondary | ICD-10-CM | POA: Diagnosis not present

## 2021-07-16 DIAGNOSIS — R5383 Other fatigue: Secondary | ICD-10-CM | POA: Diagnosis not present

## 2021-07-16 DIAGNOSIS — E1165 Type 2 diabetes mellitus with hyperglycemia: Secondary | ICD-10-CM | POA: Diagnosis not present

## 2021-07-16 DIAGNOSIS — E1151 Type 2 diabetes mellitus with diabetic peripheral angiopathy without gangrene: Secondary | ICD-10-CM | POA: Diagnosis not present

## 2021-07-16 DIAGNOSIS — Z79899 Other long term (current) drug therapy: Secondary | ICD-10-CM | POA: Diagnosis not present

## 2021-07-16 DIAGNOSIS — Z0000 Encounter for general adult medical examination without abnormal findings: Secondary | ICD-10-CM | POA: Diagnosis not present

## 2021-07-16 DIAGNOSIS — R0602 Shortness of breath: Secondary | ICD-10-CM | POA: Diagnosis not present

## 2021-07-22 DIAGNOSIS — R1013 Epigastric pain: Secondary | ICD-10-CM | POA: Diagnosis not present

## 2021-07-22 DIAGNOSIS — R109 Unspecified abdominal pain: Secondary | ICD-10-CM | POA: Diagnosis not present

## 2021-07-22 DIAGNOSIS — I700 Atherosclerosis of aorta: Secondary | ICD-10-CM | POA: Diagnosis not present

## 2021-07-22 DIAGNOSIS — R112 Nausea with vomiting, unspecified: Secondary | ICD-10-CM | POA: Diagnosis not present

## 2021-07-22 DIAGNOSIS — K760 Fatty (change of) liver, not elsewhere classified: Secondary | ICD-10-CM | POA: Diagnosis not present

## 2021-07-28 DIAGNOSIS — R1013 Epigastric pain: Secondary | ICD-10-CM | POA: Diagnosis not present

## 2021-07-28 DIAGNOSIS — R112 Nausea with vomiting, unspecified: Secondary | ICD-10-CM | POA: Diagnosis not present

## 2021-07-29 ENCOUNTER — Emergency Department (HOSPITAL_COMMUNITY): Payer: Medicare Other

## 2021-07-29 ENCOUNTER — Other Ambulatory Visit: Payer: Self-pay

## 2021-07-29 ENCOUNTER — Emergency Department (HOSPITAL_COMMUNITY)
Admission: EM | Admit: 2021-07-29 | Discharge: 2021-07-29 | Disposition: A | Discharge status: Home / Self Care | Payer: Medicare Other | Attending: Emergency Medicine | Admitting: Emergency Medicine

## 2021-07-29 ENCOUNTER — Encounter (HOSPITAL_COMMUNITY): Payer: Self-pay | Admitting: Emergency Medicine

## 2021-07-29 DIAGNOSIS — E119 Type 2 diabetes mellitus without complications: Secondary | ICD-10-CM | POA: Diagnosis not present

## 2021-07-29 DIAGNOSIS — Z9049 Acquired absence of other specified parts of digestive tract: Secondary | ICD-10-CM | POA: Diagnosis not present

## 2021-07-29 DIAGNOSIS — R109 Unspecified abdominal pain: Secondary | ICD-10-CM | POA: Diagnosis not present

## 2021-07-29 DIAGNOSIS — K760 Fatty (change of) liver, not elsewhere classified: Secondary | ICD-10-CM | POA: Diagnosis not present

## 2021-07-29 DIAGNOSIS — N401 Enlarged prostate with lower urinary tract symptoms: Secondary | ICD-10-CM | POA: Diagnosis not present

## 2021-07-29 DIAGNOSIS — Z7984 Long term (current) use of oral hypoglycemic drugs: Secondary | ICD-10-CM | POA: Diagnosis not present

## 2021-07-29 DIAGNOSIS — R1031 Right lower quadrant pain: Secondary | ICD-10-CM | POA: Insufficient documentation

## 2021-07-29 DIAGNOSIS — R112 Nausea with vomiting, unspecified: Secondary | ICD-10-CM | POA: Diagnosis not present

## 2021-07-29 DIAGNOSIS — I700 Atherosclerosis of aorta: Secondary | ICD-10-CM | POA: Diagnosis not present

## 2021-07-29 LAB — CBC
HCT: 45.3 % (ref 39.0–52.0)
Hemoglobin: 15.3 g/dL (ref 13.0–17.0)
MCH: 30.2 pg (ref 26.0–34.0)
MCHC: 33.8 g/dL (ref 30.0–36.0)
MCV: 89.3 fL (ref 80.0–100.0)
Platelets: 280 10*3/uL (ref 150–400)
RBC: 5.07 MIL/uL (ref 4.22–5.81)
RDW: 13 % (ref 11.5–15.5)
WBC: 13.3 10*3/uL — ABNORMAL HIGH (ref 4.0–10.5)
nRBC: 0 % (ref 0.0–0.2)

## 2021-07-29 LAB — COMPREHENSIVE METABOLIC PANEL
ALT: 46 U/L — ABNORMAL HIGH (ref 0–44)
AST: 30 U/L (ref 15–41)
Albumin: 4.3 g/dL (ref 3.5–5.0)
Alkaline Phosphatase: 92 U/L (ref 38–126)
Anion gap: 13 (ref 5–15)
BUN: 18 mg/dL (ref 6–20)
CO2: 28 mmol/L (ref 22–32)
Calcium: 9.5 mg/dL (ref 8.9–10.3)
Chloride: 92 mmol/L — ABNORMAL LOW (ref 98–111)
Creatinine, Ser: 0.98 mg/dL (ref 0.61–1.24)
GFR, Estimated: >60 mL/min (ref >60)
Glucose, Bld: 183 mg/dL — ABNORMAL HIGH (ref 70–99)
Potassium: 3.8 mmol/L (ref 3.5–5.1)
Sodium: 133 mmol/L — ABNORMAL LOW (ref 135–145)
Total Bilirubin: 1.5 mg/dL — ABNORMAL HIGH (ref 0.3–1.2)
Total Protein: 7.7 g/dL (ref 6.5–8.1)

## 2021-07-29 LAB — URINALYSIS, ROUTINE W REFLEX MICROSCOPIC
Bilirubin Urine: NEGATIVE
Glucose, UA: NEGATIVE mg/dL
Hgb urine dipstick: NEGATIVE
Ketones, ur: 20 mg/dL — AB
Leukocytes,Ua: NEGATIVE
Nitrite: NEGATIVE
Protein, ur: NEGATIVE mg/dL
Specific Gravity, Urine: 1.026 (ref 1.005–1.030)
pH: 6 (ref 5.0–8.0)

## 2021-07-29 LAB — LIPASE, BLOOD: Lipase: 55 U/L — ABNORMAL HIGH (ref 11–51)

## 2021-07-29 MED ORDER — MORPHINE SULFATE (PF) 4 MG/ML IV SOLN
4.0000 mg | Freq: Once | INTRAVENOUS | Status: AC
  Administered 2021-07-29: 4 mg via INTRAVENOUS
  Filled 2021-07-29: qty 1

## 2021-07-29 MED ORDER — ONDANSETRON HCL 4 MG/2ML IJ SOLN
4.0000 mg | Freq: Once | INTRAMUSCULAR | Status: AC
  Administered 2021-07-29: 4 mg via INTRAVENOUS
  Filled 2021-07-29: qty 2

## 2021-07-29 MED ORDER — METOCLOPRAMIDE HCL 10 MG PO TABS: 10.0000 mg | ORAL_TABLET | Freq: Two times a day (BID) | ORAL | 0 refills | Status: DC | PRN

## 2021-07-29 MED ORDER — SODIUM CHLORIDE 0.9 % IV BOLUS
500.0000 mL | Freq: Once | INTRAVENOUS | Status: AC
  Administered 2021-07-29: 500 mL via INTRAVENOUS

## 2021-07-29 NOTE — ED Notes (Signed)
Pt verbalized understanding of d/c instructions, meds and followup care. Denies questions. VSS, no distress noted. Steady gait to exit with all belongings.  ?

## 2021-07-29 NOTE — ED Provider Notes (Signed)
?[facility] ?Provider Note ? ? ?CSN: [id number] ?Arrival date & time: 07/29/21  1235 ? ?  ? ?History ? ?Chief Complaint  ?Patient presents with  ? Abdominal Pain  ? ? ?Todd Rose is a 46 y.o. male. ? ?HPI ?Patient is a 46 year old male with a history of bipolar 1 disorder, diabetes mellitus, BPH with obstruction/lower urinary tract symptoms who presents to the emergency department due to recurrent right flank and abdominal pain.  He states his symptoms started 1 week ago.  He was seen in the [facility] emergency department and had a CT scan with contrast.  I am unable to see these records.  Patient states that he was told everything was reassuring and was discharged on medications for GERD.  States that his pain has persisted and continued to wax and wane.  States that he is also experiencing difficulty with urination and only urinates about 2 times per day.  Reports associated nausea without vomiting.  Also notes some "increased warmth" when urinating but denies any frank hematuria or dysuria.  Denies any penile, scrotal, or rectal pain. ?  ? ?Home Medications ?Prior to Admission medications   ?Medication Sig Start Date End Date Taking? Authorizing Provider  ?metoCLOPramide (REGLAN) 10 MG tablet Take 1 tablet (10 mg total) by mouth 2 (two) times daily as needed for nausea. 07/29/21  Yes Rayna Sexton, PA-C  ?busPIRone (BUSPAR) 10 MG tablet Take 1 tablet (10 mg total) by mouth 2 (two) times daily. 05/21/21   CoxElnita Maxwell, MD  ?cyclobenzaprine (FLEXERIL) 10 MG tablet Take 1 tablet (10 mg total) by mouth 3 (three) times daily as needed for muscle spasms. 06/04/21   Lillard Anes, MD  ?gabapentin (NEURONTIN) 800 MG tablet Take 1 tablet (800 mg total) by mouth in the morning, at noon, in the evening, and at bedtime. 06/04/21   Lillard Anes, MD  ?ibuprofen (ADVIL) 200 MG tablet Take 200 mg by mouth every 6 (six) hours as needed for moderate pain.  01/02/20   [provider]  ?lisinopril (ZESTRIL) 40 MG tablet Take 1 tablet by mouth once daily 12/07/20   Lillard Anes, MD  ?meclizine (ANTIVERT) 25 MG tablet Take 1 tablet (25 mg total) by mouth 3 (three) times daily as needed for dizziness. 12/25/20   Lillard Anes, MD  ?metoprolol tartrate (LOPRESSOR) 100 MG tablet Take 1 tablet (100 mg total) by mouth once for 1 dose. 2 hours before ct. 04/13/20 01/08/21  Tobb, Godfrey Pick, DO  ?naloxone (NARCAN) nasal spray 4 mg/0.1 mL Place into the nose. 01/30/21   [provider]  ?omeprazole (PRILOSEC) 40 MG capsule Take 40 mg by mouth 2 (two) times daily. 04/13/19   [provider]  ?pravastatin (PRAVACHOL) 40 MG tablet Take 1 tablet by mouth once daily 10/14/20   Lillard Anes, MD  ?   ? ?Allergies    ?Naproxen and Tramadol   ? ?Review of Systems   ?Review of Systems  ?All other systems reviewed and are negative. ?Ten systems reviewed and are negative for acute change, except as noted in the HPI.   ?Physical Exam ?Updated Vital Signs ?BP (!) 149/111   Pulse 88   Temp 98.5 ?F (36.9 ?C) (Oral)   Resp 19   SpO2 100%  ?Physical Exam ?Vitals and nursing note reviewed.  ?Constitutional:   ?   General: He is not in acute distress. ?   Appearance: Normal appearance. He is not ill-appearing, toxic-appearing or  diaphoretic.  ?HENT:  ?   Head: Normocephalic and atraumatic.  ?   Right Ear: External ear normal.  ?   Left Ear: External ear normal.  ?   Nose: Nose normal.  ?   Mouth/Throat:  ?   Mouth: Mucous membranes are moist.  ?   Pharynx: Oropharynx is clear. No oropharyngeal exudate or posterior oropharyngeal erythema.  ?Eyes:  ?   Extraocular Movements: Extraocular movements intact.  ?Cardiovascular:  ?   Rate and Rhythm: Normal rate and regular rhythm.  ?   Pulses: Normal pulses.  ?   Heart sounds: Normal heart sounds. No murmur heard. ?  No friction rub. No gallop.  ?Pulmonary:  ?   Effort: Pulmonary effort is normal. No respiratory distress.  ?    Breath sounds: Normal breath sounds. No stridor. No wheezing, rhonchi or rales.  ?Abdominal:  ?   General: Abdomen is protuberant.  ?   Palpations: Abdomen is soft.  ?   Tenderness: There is abdominal tenderness in the right lower quadrant and suprapubic area. There is right CVA tenderness. There is no left CVA tenderness.  ?   Comments: Protuberant abdomen that is soft.  Mild tenderness noted along the right flank, right lateral abdomen, and right lower quadrant.  ?Genitourinary: ?   Comments: Male nursing chaperone present.  Normal-appearing circumcised penis.  No penile or scrotal pain noted.  No swelling or overlying lesions.  Mild TTP noted along the perineal region.  No overlying skin changes, erythema, or swelling. ?Musculoskeletal:     ?   General: Normal range of motion.  ?   Cervical back: Normal range of motion and neck supple. No tenderness.  ?Skin: ?   General: Skin is warm and dry.  ?Neurological:  ?   General: No focal deficit present.  ?   Mental Status: He is alert and oriented to person, place, and time.  ?Psychiatric:     ?   Mood and Affect: Mood normal.     ?   Behavior: Behavior normal.  ? ?ED Results / Procedures / Treatments   ?Labs ?(all labs ordered are listed, but only abnormal results are displayed) ?Labs Reviewed  ?LIPASE, BLOOD - Abnormal; Notable for the following components:  ?    Result Value  ? Lipase 55 (*)   ? All other components within normal limits  ?COMPREHENSIVE METABOLIC PANEL - Abnormal; Notable for the following components:  ? Sodium 133 (*)   ? Chloride 92 (*)   ? Glucose, Bld 183 (*)   ? ALT 46 (*)   ? Total Bilirubin 1.5 (*)   ? All other components within normal limits  ?CBC - Abnormal; Notable for the following components:  ? WBC 13.3 (*)   ? All other components within normal limits  ?URINALYSIS, ROUTINE W REFLEX MICROSCOPIC - Abnormal; Notable for the following components:  ? Ketones, ur 20 (*)   ? All other components within normal limits   ? ?EKG ?None ? ?Radiology ?CT Renal Stone Study ? ?Result Date: 07/29/2021 ?CLINICAL DATA:  Right flank pain.  Kidney stones suspected. EXAM: CT ABDOMEN AND PELVIS WITHOUT CONTRAST TECHNIQUE: Multidetector CT imaging of the abdomen and pelvis was performed following the standard protocol without IV contrast. RADIATION DOSE REDUCTION: This exam was performed according to the departmental dose-optimization program which includes automated exposure control, adjustment of the mA and/or kV according to patient size and/or use of iterative reconstruction technique. COMPARISON:  11/01/2020 FINDINGS: Lower chest: Unremarkable. Hepatobiliary:  The liver shows diffusely decreased attenuation suggesting fat deposition. No suspicious focal abnormality in the liver on this study without intravenous contrast. Gallbladder is surgically absent. No intrahepatic or extrahepatic biliary dilation. Pancreas: No focal mass lesion. No dilatation of the main duct. No intraparenchymal cyst. No peripancreatic edema. Spleen: No splenomegaly. No focal mass lesion. Adrenals/Urinary Tract: No adrenal nodule or mass. No evidence for renal or ureteral stones. No secondary changes in either kidney or ureter. No bladder stones. Stomach/Bowel: Stomach is unremarkable. No gastric wall thickening. No evidence of outlet obstruction. Duodenum is normally positioned as is the ligament of Treitz. No small bowel wall thickening. No small bowel dilatation. The terminal ileum is normal. No gross colonic mass. No colonic wall thickening. Vascular/Lymphatic: There is mild atherosclerotic calcification of the abdominal aorta without aneurysm. There is no gastrohepatic or hepatoduodenal ligament lymphadenopathy. No retroperitoneal or mesenteric lymphadenopathy. No pelvic sidewall lymphadenopathy. Reproductive: The prostate gland and seminal vesicles are unremarkable. Other: No intraperitoneal free fluid. Musculoskeletal: No worrisome lytic or sclerotic osseous  abnormality. IMPRESSION: 1. No acute findings in the abdomen or pelvis. Specifically, no findings to explain the patient's history of right flank pain. 2. Hepatic steatosis. 3. Aortic Atherosclerosis (ICD10-I70.0). Electronically Sign

## 2021-07-29 NOTE — ED Triage Notes (Signed)
Patient here for evaluation of RLQ of abdominal that started last Sunday. Patient reports no denies changes in bowel movements, reports urinating less frequently which he believes is due to less oral intake. Patient seen at [facility] one week ago for same and prescribed two medications (patient does not recall names of medications) but states those medications did not improve symptoms. Patient is alert, oriented, and in no apparent distress at this time. ?

## 2021-07-29 NOTE — ED Notes (Signed)
Pt requesting medication for pain. RN made aware. ?

## 2021-07-29 NOTE — Discharge Instructions (Signed)
I prescribed you a nausea medication called Reglan.  You can take the 2 times per day for your symptoms.  Please only take this as prescribed.  Please only take this for severe nausea/vomiting that you cannot control. ? ?Please follow-up with your regular doctor regarding your symptoms as well as this visit today.  If you develop any new or worsening symptoms please come back to the emergency department. ?

## 2021-07-31 DIAGNOSIS — R112 Nausea with vomiting, unspecified: Secondary | ICD-10-CM | POA: Diagnosis not present

## 2021-07-31 DIAGNOSIS — E119 Type 2 diabetes mellitus without complications: Secondary | ICD-10-CM | POA: Diagnosis not present

## 2021-07-31 DIAGNOSIS — E860 Dehydration: Secondary | ICD-10-CM | POA: Diagnosis not present

## 2021-07-31 DIAGNOSIS — I10 Essential (primary) hypertension: Secondary | ICD-10-CM | POA: Diagnosis not present

## 2021-07-31 DIAGNOSIS — G8929 Other chronic pain: Secondary | ICD-10-CM | POA: Diagnosis not present

## 2021-07-31 DIAGNOSIS — M549 Dorsalgia, unspecified: Secondary | ICD-10-CM | POA: Diagnosis not present

## 2021-07-31 DIAGNOSIS — R634 Abnormal weight loss: Secondary | ICD-10-CM | POA: Diagnosis not present

## 2021-07-31 DIAGNOSIS — R34 Anuria and oliguria: Secondary | ICD-10-CM | POA: Diagnosis not present

## 2021-07-31 DIAGNOSIS — Z79899 Other long term (current) drug therapy: Secondary | ICD-10-CM | POA: Diagnosis not present

## 2021-07-31 DIAGNOSIS — I700 Atherosclerosis of aorta: Secondary | ICD-10-CM | POA: Diagnosis not present

## 2021-07-31 DIAGNOSIS — R1084 Generalized abdominal pain: Secondary | ICD-10-CM | POA: Diagnosis not present

## 2021-07-31 DIAGNOSIS — R197 Diarrhea, unspecified: Secondary | ICD-10-CM | POA: Diagnosis not present

## 2021-07-31 DIAGNOSIS — Z9049 Acquired absence of other specified parts of digestive tract: Secondary | ICD-10-CM | POA: Diagnosis not present

## 2021-07-31 DIAGNOSIS — E1165 Type 2 diabetes mellitus with hyperglycemia: Secondary | ICD-10-CM | POA: Diagnosis not present

## 2021-07-31 DIAGNOSIS — K760 Fatty (change of) liver, not elsewhere classified: Secondary | ICD-10-CM | POA: Diagnosis not present

## 2021-08-01 DIAGNOSIS — Z7984 Long term (current) use of oral hypoglycemic drugs: Secondary | ICD-10-CM | POA: Diagnosis not present

## 2021-08-01 DIAGNOSIS — K760 Fatty (change of) liver, not elsewhere classified: Secondary | ICD-10-CM | POA: Diagnosis not present

## 2021-08-01 DIAGNOSIS — I10 Essential (primary) hypertension: Secondary | ICD-10-CM | POA: Diagnosis not present

## 2021-08-01 DIAGNOSIS — E1169 Type 2 diabetes mellitus with other specified complication: Secondary | ICD-10-CM | POA: Diagnosis not present

## 2021-08-01 DIAGNOSIS — G8929 Other chronic pain: Secondary | ICD-10-CM | POA: Diagnosis not present

## 2021-08-01 DIAGNOSIS — R112 Nausea with vomiting, unspecified: Secondary | ICD-10-CM | POA: Diagnosis not present

## 2021-08-02 DIAGNOSIS — R112 Nausea with vomiting, unspecified: Secondary | ICD-10-CM | POA: Diagnosis not present

## 2021-08-02 DIAGNOSIS — K760 Fatty (change of) liver, not elsewhere classified: Secondary | ICD-10-CM | POA: Diagnosis not present

## 2021-08-02 DIAGNOSIS — I700 Atherosclerosis of aorta: Secondary | ICD-10-CM | POA: Diagnosis not present

## 2021-08-06 ENCOUNTER — Other Ambulatory Visit: Payer: Self-pay | Admitting: Legal Medicine

## 2021-08-06 DIAGNOSIS — E782 Mixed hyperlipidemia: Secondary | ICD-10-CM

## 2021-08-13 DIAGNOSIS — R600 Localized edema: Secondary | ICD-10-CM | POA: Diagnosis not present

## 2021-08-13 DIAGNOSIS — K219 Gastro-esophageal reflux disease without esophagitis: Secondary | ICD-10-CM | POA: Diagnosis not present

## 2021-08-13 DIAGNOSIS — I10 Essential (primary) hypertension: Secondary | ICD-10-CM | POA: Diagnosis not present

## 2021-08-22 DIAGNOSIS — M549 Dorsalgia, unspecified: Secondary | ICD-10-CM | POA: Diagnosis not present

## 2021-08-22 DIAGNOSIS — G8929 Other chronic pain: Secondary | ICD-10-CM | POA: Diagnosis not present

## 2021-08-22 DIAGNOSIS — Z0130 Encounter for examination of blood pressure without abnormal findings: Secondary | ICD-10-CM | POA: Diagnosis not present

## 2021-08-22 DIAGNOSIS — R030 Elevated blood-pressure reading, without diagnosis of hypertension: Secondary | ICD-10-CM | POA: Diagnosis not present

## 2021-08-22 DIAGNOSIS — Z79899 Other long term (current) drug therapy: Secondary | ICD-10-CM | POA: Diagnosis not present

## 2021-08-22 DIAGNOSIS — R7401 Elevation of levels of liver transaminase levels: Secondary | ICD-10-CM | POA: Diagnosis not present

## 2021-08-22 DIAGNOSIS — M25569 Pain in unspecified knee: Secondary | ICD-10-CM | POA: Diagnosis not present

## 2021-08-25 DIAGNOSIS — R7401 Elevation of levels of liver transaminase levels: Secondary | ICD-10-CM | POA: Diagnosis not present

## 2021-08-25 DIAGNOSIS — R030 Elevated blood-pressure reading, without diagnosis of hypertension: Secondary | ICD-10-CM | POA: Diagnosis not present

## 2021-08-25 DIAGNOSIS — Z79899 Other long term (current) drug therapy: Secondary | ICD-10-CM | POA: Diagnosis not present

## 2021-08-25 DIAGNOSIS — M545 Low back pain, unspecified: Secondary | ICD-10-CM | POA: Diagnosis not present

## 2021-08-25 DIAGNOSIS — G8929 Other chronic pain: Secondary | ICD-10-CM | POA: Diagnosis not present

## 2021-09-15 DIAGNOSIS — H40033 Anatomical narrow angle, bilateral: Secondary | ICD-10-CM | POA: Diagnosis not present

## 2021-09-15 DIAGNOSIS — E119 Type 2 diabetes mellitus without complications: Secondary | ICD-10-CM | POA: Diagnosis not present

## 2021-09-22 DIAGNOSIS — M545 Low back pain, unspecified: Secondary | ICD-10-CM | POA: Diagnosis not present

## 2021-09-22 DIAGNOSIS — Z79899 Other long term (current) drug therapy: Secondary | ICD-10-CM | POA: Diagnosis not present

## 2021-09-22 DIAGNOSIS — R030 Elevated blood-pressure reading, without diagnosis of hypertension: Secondary | ICD-10-CM | POA: Diagnosis not present

## 2021-09-22 DIAGNOSIS — G8929 Other chronic pain: Secondary | ICD-10-CM | POA: Diagnosis not present

## 2021-10-01 ENCOUNTER — Encounter (HOSPITAL_COMMUNITY): Payer: Self-pay

## 2021-10-01 ENCOUNTER — Emergency Department (HOSPITAL_COMMUNITY)
Admission: EM | Admit: 2021-10-01 | Discharge: 2021-10-01 | Disposition: A | Discharge status: Home / Self Care | Payer: Medicare Other | Attending: Emergency Medicine | Admitting: Emergency Medicine

## 2021-10-01 ENCOUNTER — Emergency Department (HOSPITAL_COMMUNITY): Payer: Medicare Other

## 2021-10-01 DIAGNOSIS — E119 Type 2 diabetes mellitus without complications: Secondary | ICD-10-CM | POA: Insufficient documentation

## 2021-10-01 DIAGNOSIS — R1084 Generalized abdominal pain: Secondary | ICD-10-CM | POA: Diagnosis not present

## 2021-10-01 DIAGNOSIS — R109 Unspecified abdominal pain: Secondary | ICD-10-CM | POA: Diagnosis not present

## 2021-10-01 DIAGNOSIS — K5900 Constipation, unspecified: Secondary | ICD-10-CM | POA: Insufficient documentation

## 2021-10-01 DIAGNOSIS — K760 Fatty (change of) liver, not elsewhere classified: Secondary | ICD-10-CM | POA: Diagnosis not present

## 2021-10-01 DIAGNOSIS — R110 Nausea: Secondary | ICD-10-CM

## 2021-10-01 DIAGNOSIS — D72829 Elevated white blood cell count, unspecified: Secondary | ICD-10-CM | POA: Diagnosis not present

## 2021-10-01 DIAGNOSIS — R112 Nausea with vomiting, unspecified: Secondary | ICD-10-CM | POA: Diagnosis not present

## 2021-10-01 DIAGNOSIS — R1010 Upper abdominal pain, unspecified: Secondary | ICD-10-CM | POA: Diagnosis not present

## 2021-10-01 LAB — COMPREHENSIVE METABOLIC PANEL
ALT: 31 U/L (ref 0–44)
AST: 23 U/L (ref 15–41)
Albumin: 4.4 g/dL (ref 3.5–5.0)
Alkaline Phosphatase: 94 U/L (ref 38–126)
Anion gap: 9 (ref 5–15)
BUN: 13 mg/dL (ref 6–20)
CO2: 29 mmol/L (ref 22–32)
Calcium: 9.6 mg/dL (ref 8.9–10.3)
Chloride: 101 mmol/L (ref 98–111)
Creatinine, Ser: 0.78 mg/dL (ref 0.61–1.24)
GFR, Estimated: >60 mL/min (ref >60)
Glucose, Bld: 177 mg/dL — ABNORMAL HIGH (ref 70–99)
Potassium: 3.5 mmol/L (ref 3.5–5.1)
Sodium: 139 mmol/L (ref 135–145)
Total Bilirubin: 1.1 mg/dL (ref 0.3–1.2)
Total Protein: 8.1 g/dL (ref 6.5–8.1)

## 2021-10-01 LAB — URINALYSIS, ROUTINE W REFLEX MICROSCOPIC
Bilirubin Urine: NEGATIVE
Glucose, UA: NEGATIVE mg/dL
Hgb urine dipstick: NEGATIVE
Ketones, ur: 20 mg/dL — AB
Leukocytes,Ua: NEGATIVE
Nitrite: NEGATIVE
Protein, ur: 30 mg/dL — AB
Specific Gravity, Urine: 1.024 (ref 1.005–1.030)
pH: 5 (ref 5.0–8.0)

## 2021-10-01 LAB — CBC WITH DIFFERENTIAL/PLATELET
Abs Immature Granulocytes: 0.04 10*3/uL (ref 0.00–0.07)
Basophils Absolute: 0 10*3/uL (ref 0.0–0.1)
Basophils Relative: 0 %
Eosinophils Absolute: 0 10*3/uL (ref 0.0–0.5)
Eosinophils Relative: 0 %
HCT: 42.7 % (ref 39.0–52.0)
Hemoglobin: 14.7 g/dL (ref 13.0–17.0)
Immature Granulocytes: 0 %
Lymphocytes Relative: 18 %
Lymphs Abs: 2.1 10*3/uL (ref 0.7–4.0)
MCH: 31.1 pg (ref 26.0–34.0)
MCHC: 34.4 g/dL (ref 30.0–36.0)
MCV: 90.3 fL (ref 80.0–100.0)
Monocytes Absolute: 0.6 10*3/uL (ref 0.1–1.0)
Monocytes Relative: 5 %
Neutro Abs: 8.9 10*3/uL — ABNORMAL HIGH (ref 1.7–7.7)
Neutrophils Relative %: 77 %
Platelets: 240 10*3/uL (ref 150–400)
RBC: 4.73 MIL/uL (ref 4.22–5.81)
RDW: 12.7 % (ref 11.5–15.5)
WBC: 11.7 10*3/uL — ABNORMAL HIGH (ref 4.0–10.5)
nRBC: 0 % (ref 0.0–0.2)

## 2021-10-01 LAB — LIPASE, BLOOD: Lipase: 28 U/L (ref 11–51)

## 2021-10-01 MED ORDER — DICYCLOMINE HCL 20 MG PO TABS: 20.0000 mg | ORAL_TABLET | Freq: Two times a day (BID) | ORAL | 0 refills | Status: DC | PRN

## 2021-10-01 MED ORDER — ONDANSETRON HCL 4 MG/2ML IJ SOLN
4.0000 mg | Freq: Once | INTRAMUSCULAR | Status: AC
  Administered 2021-10-01: 4 mg via INTRAVENOUS
  Filled 2021-10-01: qty 2

## 2021-10-01 MED ORDER — SODIUM CHLORIDE (PF) 0.9 % IJ SOLN
INTRAMUSCULAR | Status: AC
  Filled 2021-10-01: qty 50

## 2021-10-01 MED ORDER — FENTANYL CITRATE PF 50 MCG/ML IJ SOSY
50.0000 ug | PREFILLED_SYRINGE | Freq: Once | INTRAMUSCULAR | Status: AC
  Administered 2021-10-01: 50 ug via INTRAVENOUS
  Filled 2021-10-01: qty 1

## 2021-10-01 MED ORDER — FAMOTIDINE IN NACL 20-0.9 MG/50ML-% IV SOLN
20.0000 mg | Freq: Once | INTRAVENOUS | Status: AC
  Administered 2021-10-01: 20 mg via INTRAVENOUS
  Filled 2021-10-01: qty 50

## 2021-10-01 MED ORDER — OMEPRAZOLE 40 MG PO CPDR: 40.0000 mg | DELAYED_RELEASE_CAPSULE | Freq: Every day | ORAL | 0 refills | Status: AC

## 2021-10-01 MED ORDER — IOHEXOL 300 MG/ML  SOLN
100.0000 mL | Freq: Once | INTRAMUSCULAR | Status: AC | PRN
  Administered 2021-10-01: 100 mL via INTRAVENOUS

## 2021-10-01 MED ORDER — SODIUM CHLORIDE 0.9 % IV BOLUS
1000.0000 mL | Freq: Once | INTRAVENOUS | Status: AC
  Administered 2021-10-01: 1000 mL via INTRAVENOUS

## 2021-10-01 MED ORDER — ONDANSETRON HCL 4 MG PO TABS: 4.0000 mg | ORAL_TABLET | Freq: Three times a day (TID) | ORAL | 0 refills | Status: AC | PRN

## 2021-10-01 NOTE — ED Provider Notes (Signed)
[place] DEPT Provider Note   CSN: [id number] Arrival date & time: 10/01/21  2774     History  Chief Complaint  Patient presents with   Abdominal Pain    Todd Rose is a 46 y.o. male.  He has a history of diabetes.  Complaining of upper abdominal pain nausea and vomiting constipation that is been going on for a week.  Mostly dry heaving with some yellow liquid.  Denies any blood from above or below.  No fevers or chills.  Prior history of cholecystectomy.  He said he was admitted to [facility] over a month ago for similar, thought to possibly have gastroparesis.  Infrequent marijuana.  The history is provided by the patient.  Abdominal Pain Pain location:  Generalized Pain quality: cramping   Pain radiates to:  Does not radiate Pain severity:  Moderate Onset quality:  Gradual Duration:  1 week Timing:  Intermittent Progression:  Unchanged Chronicity:  Recurrent Context: not trauma   Relieved by:  Nothing Worsened by:  Nothing Ineffective treatments:  None tried Associated symptoms: constipation, nausea and vomiting   Associated symptoms: no chest pain, no cough, no diarrhea, no dysuria, no fever, no hematemesis, no hematochezia, no hematuria, no shortness of breath and no sore throat       Home Medications Prior to Admission medications   Medication Sig Start Date End Date Taking? Authorizing Provider  busPIRone (BUSPAR) 10 MG tablet Take 1 tablet (10 mg total) by mouth 2 (two) times daily. 05/21/21   Cox, Elnita Maxwell, MD  cyclobenzaprine (FLEXERIL) 10 MG tablet Take 1 tablet (10 mg total) by mouth 3 (three) times daily as needed for muscle spasms. 06/04/21   Lillard Anes, MD  gabapentin (NEURONTIN) 800 MG tablet Take 1 tablet (800 mg total) by mouth in the morning, at noon, in the evening, and at bedtime. 06/04/21   Lillard Anes, MD  ibuprofen (ADVIL) 200 MG tablet Take 200 mg by mouth every 6 (six) hours as needed for moderate  pain.  01/02/20   [provider]  lisinopril (ZESTRIL) 40 MG tablet Take 1 tablet by mouth once daily 12/07/20   Lillard Anes, MD  meclizine (ANTIVERT) 25 MG tablet Take 1 tablet (25 mg total) by mouth 3 (three) times daily as needed for dizziness. 12/25/20   Lillard Anes, MD  metoCLOPramide (REGLAN) 10 MG tablet Take 1 tablet (10 mg total) by mouth 2 (two) times daily as needed for nausea. 07/29/21   Rayna Sexton, PA-C  metoprolol tartrate (LOPRESSOR) 100 MG tablet Take 1 tablet (100 mg total) by mouth once for 1 dose. 2 hours before ct. 04/13/20 01/08/21  Tobb, Godfrey Pick, DO  naloxone (NARCAN) nasal spray 4 mg/0.1 mL Place into the nose. 01/30/21   [provider]  omeprazole (PRILOSEC) 40 MG capsule Take 40 mg by mouth 2 (two) times daily. 04/13/19   [provider]  pravastatin (PRAVACHOL) 40 MG tablet Take 1 tablet by mouth once daily 08/06/21   Lillard Anes, MD      Allergies    Naproxen and Tramadol    Review of Systems   Review of Systems  Constitutional:  Negative for fever.  HENT:  Negative for sore throat.   Eyes:  Negative for visual disturbance.  Respiratory:  Negative for cough and shortness of breath.   Cardiovascular:  Negative for chest pain.  Gastrointestinal:  Positive for abdominal pain, constipation, nausea and vomiting. Negative for diarrhea, hematemesis and hematochezia.  Genitourinary:  Negative for dysuria and hematuria.  Musculoskeletal:  Negative for neck pain.  Skin:  Negative for rash.  Neurological:  Negative for headaches.   Physical Exam Updated Vital Signs BP (!) 149/124 (BP Location: Right Arm)   Pulse 71   Temp 98.1 F (36.7 C) (Oral)   Resp 18   Ht 5' 7"  (1.702 m)   Wt 113.9 kg   SpO2 97%   BMI 39.31 kg/m  Physical Exam Vitals and nursing note reviewed.  Constitutional:      General: He is not in acute distress.    Appearance: Normal appearance. He is well-developed.  HENT:     Head:  Normocephalic and atraumatic.  Eyes:     Conjunctiva/sclera: Conjunctivae normal.  Cardiovascular:     Rate and Rhythm: Normal rate and regular rhythm.     Heart sounds: No murmur heard. Pulmonary:     Effort: Pulmonary effort is normal. No respiratory distress.     Breath sounds: Normal breath sounds.  Abdominal:     Palpations: Abdomen is soft.     Tenderness: There is no abdominal tenderness. There is no guarding or rebound.  Musculoskeletal:        General: No swelling. Normal range of motion.     Cervical back: Neck supple.  Skin:    General: Skin is warm and dry.     Capillary Refill: Capillary refill takes less than 2 seconds.  Neurological:     General: No focal deficit present.     Mental Status: He is alert.    ED Results / Procedures / Treatments   Labs (all labs ordered are listed, but only abnormal results are displayed) Labs Reviewed  COMPREHENSIVE METABOLIC PANEL - Abnormal; Notable for the following components:      Result Value   Glucose, Bld 177 (*)    All other components within normal limits  CBC WITH DIFFERENTIAL/PLATELET - Abnormal; Notable for the following components:   WBC 11.7 (*)    Neutro Abs 8.9 (*)    All other components within normal limits  URINALYSIS, ROUTINE W REFLEX MICROSCOPIC - Abnormal; Notable for the following components:   APPearance CLOUDY (*)    Ketones, ur 20 (*)    Protein, ur 30 (*)    Bacteria, UA RARE (*)    All other components within normal limits  LIPASE, BLOOD    EKG None  Radiology CT Abdomen Pelvis W Contrast  Result Date: 10/01/2021 CLINICAL DATA:  Abdominal pain, acute, nonlocalized EXAM: CT ABDOMEN AND PELVIS WITH CONTRAST TECHNIQUE: Multidetector CT imaging of the abdomen and pelvis was performed using the standard protocol following bolus administration of intravenous contrast. RADIATION DOSE REDUCTION: This exam was performed according to the departmental dose-optimization program which includes automated  exposure control, adjustment of the mA and/or kV according to patient size and/or use of iterative reconstruction technique. CONTRAST:  173m OMNIPAQUE IOHEXOL 300 MG/ML  SOLN COMPARISON:  CT 07/29/2021 FINDINGS: Lower chest: No acute abnormality. Hepatobiliary: Diffuse hepatic steatosis.  Prior cholecystectomy. Pancreas: Unremarkable. No pancreatic ductal dilatation or surrounding inflammatory changes. Spleen: Normal in size with small adjacent splenule. Adrenals/Urinary Tract: Adrenal glands are unremarkable. No hydronephrosis or nephrolithiasis. Minimal bladder distension. Stomach/Bowel: The stomach is within normal limits. There is no evidence of bowel obstruction.The appendix is normal. Vascular/Lymphatic: No significant vascular findings are present. No enlarged abdominal or pelvic lymph nodes. Reproductive: Unremarkable. Other: No abdominal wall hernia or abnormality. No abdominopelvic ascites. Musculoskeletal: No acute or significant osseous  findings. IMPRESSION: No acute abdominopelvic abnormality.  Normal appendix. Hepatic steatosis. Electronically Signed   By: Maurine Simmering M.D.   On: 10/01/2021 12:23    Procedures Procedures    Medications Ordered in ED Medications  sodium chloride 0.9 % bolus 1,000 mL (has no administration in time range)  ondansetron (ZOFRAN) injection 4 mg (has no administration in time range)  famotidine (PEPCID) IVPB 20 mg premix (has no administration in time range)  fentaNYL (SUBLIMAZE) injection 50 mcg (has no administration in time range)    ED Course/ Medical Decision Making/ A&P Clinical Course as of 10/01/21 1847  Mon Oct 01, 2021  1334 Results of work-up with patient.  He seemed frustrated because he said he has been to the ED multiple times for this and nobody ever seems to come up with an answer for him.  He said his PCP referred him to GI but he does not have an appointment till July.  Will cover with PPI, nausea medication, antispasmodic.  We will put in a  referral to GI for him also.  Return instructions discussed [MB]    Clinical Course User Index [MB] Hayden Rasmussen, MD                           Medical Decision Making Amount and/or Complexity of Data Reviewed Labs: ordered. Radiology: ordered.  Risk Prescription drug management.  This patient complains of abdominal cramps constipation nausea; this involves an extensive number of treatment Options and is a complaint that carries with it a high risk of complications and morbidity. The differential includes gastritis, peptic ulcer disease, obstruction hepatitis, pancreatitis, constipation  I ordered, reviewed and interpreted labs, which included CBC with mildly elevated white count normal hemoglobin, chemistries normal than elevated glucose, LFTs normal, urinalysis without signs of infection I ordered medication IV fluids and Pepcid, pain medication nausea medication and reviewed PMP when indicated. I ordered imaging studies which included CT abdomen and pelvis and I independently    visualized and interpreted imaging which showed no acute findings Previous records obtained and reviewed including ED visit back in March  Social determinants considered, ongoing tobacco use Critical Interventions: None  After the interventions stated above, I reevaluated the patient and found patient to be somewhat improved with a benign abdominal exam Admission and further testing considered, no indications for admission or further testing at this time.  Will treat symptomatically and have put a referral in for him to see GI.  Return instructions discussed          Final Clinical Impression(s) / ED Diagnoses Final diagnoses:  Pain of upper abdomen  Nausea    Rx / DC Orders ED Discharge Orders          Ordered    omeprazole (PRILOSEC) 40 MG capsule  Daily        10/01/21 1337    ondansetron (ZOFRAN) 4 MG tablet  Every 8 hours PRN        10/01/21 1337    dicyclomine (BENTYL) 20 MG  tablet  2 times daily PRN        10/01/21 1337    Ambulatory referral to Gastroenterology        10/01/21 1337              Hayden Rasmussen, MD 10/01/21 [phone]

## 2021-10-01 NOTE — ED Triage Notes (Signed)
Pt arrived via POV, c/o n/v and diffuse abd pain. States he was seen recently for same, with no definitive dx. Denies any diarrhea, or urinary sx.

## 2021-10-01 NOTE — Discharge Instructions (Signed)
You were seen in the emergency department for evaluation of abdominal pain and nausea.  You had lab work urinalysis and a CAT scan that did not show an obvious explanation for your symptoms.  We are starting you back on some acid medication and some spasm medication along with nausea medication.  We have also put a referral into GI for you.  Please continue your regular medications and follow-up with your primary care doctor.  Return to the emergency department if any worsening or concerning symptoms.

## 2021-10-02 ENCOUNTER — Emergency Department (HOSPITAL_COMMUNITY)
Admission: EM | Admit: 2021-10-02 | Discharge: 2021-10-02 | Disposition: A | Discharge status: Home / Self Care | Payer: Medicare Other | Attending: Emergency Medicine | Admitting: Emergency Medicine

## 2021-10-02 ENCOUNTER — Encounter (HOSPITAL_COMMUNITY): Payer: Self-pay

## 2021-10-02 ENCOUNTER — Other Ambulatory Visit: Payer: Self-pay

## 2021-10-02 DIAGNOSIS — R112 Nausea with vomiting, unspecified: Secondary | ICD-10-CM | POA: Diagnosis not present

## 2021-10-02 DIAGNOSIS — Z79899 Other long term (current) drug therapy: Secondary | ICD-10-CM | POA: Insufficient documentation

## 2021-10-02 DIAGNOSIS — D72829 Elevated white blood cell count, unspecified: Secondary | ICD-10-CM | POA: Diagnosis not present

## 2021-10-02 DIAGNOSIS — E1165 Type 2 diabetes mellitus with hyperglycemia: Secondary | ICD-10-CM | POA: Insufficient documentation

## 2021-10-02 DIAGNOSIS — I10 Essential (primary) hypertension: Secondary | ICD-10-CM | POA: Diagnosis not present

## 2021-10-02 DIAGNOSIS — R1012 Left upper quadrant pain: Secondary | ICD-10-CM | POA: Diagnosis not present

## 2021-10-02 DIAGNOSIS — F17210 Nicotine dependence, cigarettes, uncomplicated: Secondary | ICD-10-CM | POA: Diagnosis not present

## 2021-10-02 LAB — URINALYSIS, ROUTINE W REFLEX MICROSCOPIC
Bilirubin Urine: NEGATIVE
Glucose, UA: NEGATIVE mg/dL
Hgb urine dipstick: NEGATIVE
Ketones, ur: 5 mg/dL — AB
Nitrite: NEGATIVE
Protein, ur: 30 mg/dL — AB
Specific Gravity, Urine: 1.035 — ABNORMAL HIGH (ref 1.005–1.030)
pH: 5 (ref 5.0–8.0)

## 2021-10-02 LAB — CBC WITH DIFFERENTIAL/PLATELET
Abs Immature Granulocytes: 0.05 10*3/uL (ref 0.00–0.07)
Basophils Absolute: 0 10*3/uL (ref 0.0–0.1)
Basophils Relative: 0 %
Eosinophils Absolute: 0 10*3/uL (ref 0.0–0.5)
Eosinophils Relative: 0 %
HCT: 40.2 % (ref 39.0–52.0)
Hemoglobin: 14 g/dL (ref 13.0–17.0)
Immature Granulocytes: 0 %
Lymphocytes Relative: 16 %
Lymphs Abs: 2.1 10*3/uL (ref 0.7–4.0)
MCH: 31.3 pg (ref 26.0–34.0)
MCHC: 34.8 g/dL (ref 30.0–36.0)
MCV: 89.7 fL (ref 80.0–100.0)
Monocytes Absolute: 0.8 10*3/uL (ref 0.1–1.0)
Monocytes Relative: 6 %
Neutro Abs: 10 10*3/uL — ABNORMAL HIGH (ref 1.7–7.7)
Neutrophils Relative %: 78 %
Platelets: 226 10*3/uL (ref 150–400)
RBC: 4.48 MIL/uL (ref 4.22–5.81)
RDW: 12.7 % (ref 11.5–15.5)
WBC: 13 10*3/uL — ABNORMAL HIGH (ref 4.0–10.5)
nRBC: 0 % (ref 0.0–0.2)

## 2021-10-02 LAB — COMPREHENSIVE METABOLIC PANEL
ALT: 28 U/L (ref 0–44)
AST: 20 U/L (ref 15–41)
Albumin: 4.2 g/dL (ref 3.5–5.0)
Alkaline Phosphatase: 84 U/L (ref 38–126)
Anion gap: 9 (ref 5–15)
BUN: 12 mg/dL (ref 6–20)
CO2: 29 mmol/L (ref 22–32)
Calcium: 9.4 mg/dL (ref 8.9–10.3)
Chloride: 101 mmol/L (ref 98–111)
Creatinine, Ser: 0.89 mg/dL (ref 0.61–1.24)
GFR, Estimated: >60 mL/min (ref >60)
Glucose, Bld: 195 mg/dL — ABNORMAL HIGH (ref 70–99)
Potassium: 3.7 mmol/L (ref 3.5–5.1)
Sodium: 139 mmol/L (ref 135–145)
Total Bilirubin: 1.1 mg/dL (ref 0.3–1.2)
Total Protein: 7.5 g/dL (ref 6.5–8.1)

## 2021-10-02 LAB — LIPASE, BLOOD: Lipase: 25 U/L (ref 11–51)

## 2021-10-02 MED ORDER — HYOSCYAMINE SULFATE 0.125 MG SL SUBL
0.1250 mg | SUBLINGUAL_TABLET | Freq: Once | SUBLINGUAL | Status: AC
  Administered 2021-10-02: 0.125 mg via ORAL
  Filled 2021-10-02: qty 1

## 2021-10-02 MED ORDER — METOCLOPRAMIDE HCL 10 MG PO TABS: 10.0000 mg | ORAL_TABLET | Freq: Three times a day (TID) | ORAL | 0 refills | Status: AC | PRN

## 2021-10-02 MED ORDER — METOCLOPRAMIDE HCL 10 MG PO TABS
10.0000 mg | ORAL_TABLET | Freq: Once | ORAL | Status: AC
  Administered 2021-10-02: 10 mg via ORAL
  Filled 2021-10-02: qty 1

## 2021-10-02 MED ORDER — ALUM & MAG HYDROXIDE-SIMETH 200-200-20 MG/5ML PO SUSP
30.0000 mL | Freq: Once | ORAL | Status: AC
  Administered 2021-10-02: 30 mL via ORAL
  Filled 2021-10-02: qty 30

## 2021-10-02 MED ORDER — LIDOCAINE VISCOUS HCL 2 % MT SOLN
15.0000 mL | Freq: Once | OROMUCOSAL | Status: AC
  Administered 2021-10-02: 15 mL via ORAL
  Filled 2021-10-02: qty 15

## 2021-10-02 MED ORDER — HYOSCYAMINE SULFATE 0.125 MG PO TABS: 0.1250 mg | ORAL_TABLET | Freq: Once | ORAL | Status: DC

## 2021-10-02 MED ORDER — METOCLOPRAMIDE HCL 10 MG PO TBDP: 10.0000 mg | ORAL_TABLET | Freq: Three times a day (TID) | ORAL | 0 refills | Status: AC | PRN

## 2021-10-02 MED ORDER — METOCLOPRAMIDE HCL 5 MG/ML IJ SOLN
10.0000 mg | Freq: Once | INTRAMUSCULAR | Status: AC
Start: 2021-10-02 — End: 2021-10-02
  Administered 2021-10-02: 10 mg via INTRAVENOUS
  Filled 2021-10-02: qty 2

## 2021-10-02 MED ORDER — SODIUM CHLORIDE 0.9 % IV BOLUS
1000.0000 mL | Freq: Once | INTRAVENOUS | Status: AC
  Administered 2021-10-02: 1000 mL via INTRAVENOUS

## 2021-10-02 MED ORDER — HYOSCYAMINE SULFATE SL 0.125 MG SL SUBL: 1.0000 | SUBLINGUAL_TABLET | Freq: Four times a day (QID) | SUBLINGUAL | 0 refills | Status: AC | PRN

## 2021-10-02 NOTE — ED Provider Notes (Signed)
[facility] DEPT Provider Note  CSN: [id number] Arrival date & time: 10/02/21 0110  Chief Complaint(s) Abdominal Pain and Nausea  HPI Todd Rose is a 46 y.o. male with a past medical history listed below including bipolar, diabetes, gastroparesis who presents to the emergency department for recurring left upper quadrant abdominal pain and cramping with nausea, nonbloody nonbilious emesis and inability to tolerate p.o. intake.  He was seen earlier in the day and had extensive work-up including CT scan that did not reveal any serious intra-abdominal inflammatory/infectious process or bowel obstruction at that time.  He was not in DKA either.  He was treated symptomatically, able to tolerate p.o. and discharged home.  After several hours at home, he reports that he has been unable to tolerate p.o. intake and his symptoms recurred.  He denies any other acute changes.   Abdominal Pain  Past Medical History Past Medical History:  Diagnosis Date   Back pain    Benign hypertension 08/05/2019   Bipolar 1 disorder ([place]) 08/05/2019   BPH with obstruction/lower urinary tract symptoms 06/21/2019   Chronic midline low back pain with right-sided sciatica 10/25/2019   Last Assessment & Plan:  Formatting of this note might be different from the original. This is a 46 year old gentleman with persistent and constant low back pain.  It has been going on for years but got worse over the last 2 years.  It goes down the side of his leg to his foot.  He is on disability for his back.  However he does have 5 children.  Some of those kids he has to lift up and take care    Cigarette nicotine dependence without complication 9/37/3428   DDD (degenerative disc disease), lumbar 04/29/2019   Last Assessment & Plan:  Very pleasant 46 year old overweight male with multilevel degenerative changes lumbar spine including degenerative disc disease, neural foraminal stenosis, ligamentum flavum  hypertrophy and lumbar radiculopathy.  The patient underwent a right-sided L4-L5 lumbar epidural steroid injection in the clinic today without apparent complication.  He is a candidate for future injec   Diabetes mellitus without complication ([place])    Gastroenteritis 06/21/2019   Hypertension    Lumbar radiculopathy 04/29/2019   Last Assessment & Plan:  Continue Lyrica 150 mg b.i.d.   Mixed hyperlipidemia 08/05/2019   Morbid obesity ([place]) 01/23/2020   Neural foraminal stenosis of lumbar spine 7/68/1157   Nonalcoholic steatohepatitis ([facility])    Pain medication agreement 05/27/2019   Last Assessment & Plan:  Formatting of this note might be different from the original. UDS completed today.  Review of prior UDS results are within normal limits.   Primary osteoarthritis of right knee 12/16/2013   S/P arthroscopy of knee 12/16/2013   Spinal stenosis of lumbar region with neurogenic claudication 04/29/2019   Last Assessment & Plan:  See degenerative disc plan.   Type II diabetes mellitus with peripheral circulatory disorder ([place]) 08/05/2019   Patient Active Problem List   Diagnosis Date Noted   Cervical strain, acute 05/10/2021   Sinusitis 05/10/2021   Chronic, continuous use of opioids 02/27/2021   Vertigo 12/25/2020   Acute cerebrovascular insufficiency transient focal neurologic deficit 12/25/2020   Lumbar post-laminectomy syndrome 07/11/2020   Obstructive chronic bronchitis with exacerbation ([place]) 05/25/2020   Fatigue 04/13/2020   Daytime somnolence 04/13/2020   Obesity (BMI 30-39.9) 04/13/2020   Depression 04/13/2020   Grief 04/13/2020   Back pain    Morbid obesity ([place]) 01/23/2020   Cigarette nicotine dependence without complication  10/25/2019   Chronic midline low back pain with right-sided sciatica 10/25/2019   Neural foraminal stenosis of lumbar spine 10/01/2019   Type II diabetes mellitus with peripheral circulatory disorder ([place]) 08/05/2019   Benign hypertension 08/05/2019   Bipolar  1 disorder ([place]) 08/05/2019   Mixed hyperlipidemia 56/86/1683   Nonalcoholic steatohepatitis ([facility])    BPH with obstruction/lower urinary tract symptoms 06/21/2019   Pain medication agreement 05/27/2019   DDD (degenerative disc disease), lumbar 04/29/2019   Lumbar radiculopathy 04/29/2019   Spinal stenosis of lumbar region with neurogenic claudication 04/29/2019   Primary osteoarthritis of right knee 12/16/2013   S/P arthroscopy of knee 12/16/2013   Home Medication(s) Prior to Admission medications   Medication Sig Start Date End Date Taking? Authorizing Provider  Hyoscyamine Sulfate SL (LEVSIN/SL) 0.125 MG SUBL Place 1 each under the tongue 4 (four) times daily as needed for up to 5 days. 10/02/21 10/07/21 Yes [place] Grundman, Grayce Sessions, MD  metoCLOPramide (REGLAN) 10 MG tablet Take 1 tablet (10 mg total) by mouth every 8 (eight) hours as needed for nausea. 10/02/21  Yes Tayten Heber, Grayce Sessions, MD  Metoclopramide HCl 10 MG TBDP Take 1 tablet (10 mg total) by mouth 3 (three) times daily as needed (nausea/vomiting). 10/02/21  Yes Teo Moede, Grayce Sessions, MD  busPIRone (BUSPAR) 10 MG tablet Take 1 tablet (10 mg total) by mouth 2 (two) times daily. 05/21/21   Cox, Elnita Maxwell, MD  cyclobenzaprine (FLEXERIL) 10 MG tablet Take 1 tablet (10 mg total) by mouth 3 (three) times daily as needed for muscle spasms. 06/04/21   Lillard Anes, MD  gabapentin (NEURONTIN) 800 MG tablet Take 1 tablet (800 mg total) by mouth in the morning, at noon, in the evening, and at bedtime. 06/04/21   Lillard Anes, MD  ibuprofen (ADVIL) 200 MG tablet Take 200 mg by mouth every 6 (six) hours as needed for moderate pain.  01/02/20   [provider]  lisinopril (ZESTRIL) 40 MG tablet Take 1 tablet by mouth once daily 12/07/20   Lillard Anes, MD  meclizine (ANTIVERT) 25 MG tablet Take 1 tablet (25 mg total) by mouth 3 (three) times daily as needed for dizziness. 12/25/20   Lillard Anes, MD   metoprolol tartrate (LOPRESSOR) 100 MG tablet Take 1 tablet (100 mg total) by mouth once for 1 dose. 2 hours before ct. 04/13/20 01/08/21  Tobb, Godfrey Pick, DO  naloxone (NARCAN) nasal spray 4 mg/0.1 mL Place into the nose. 01/30/21   [provider]  omeprazole (PRILOSEC) 40 MG capsule Take 1 capsule (40 mg total) by mouth daily. 10/01/21   Hayden Rasmussen, MD  ondansetron (ZOFRAN) 4 MG tablet Take 1 tablet (4 mg total) by mouth every 8 (eight) hours as needed for nausea or vomiting. 10/01/21   Hayden Rasmussen, MD  pravastatin (PRAVACHOL) 40 MG tablet Take 1 tablet by mouth once daily 08/06/21   Lillard Anes, MD  Allergies Naproxen and Tramadol  Review of Systems Review of Systems  Gastrointestinal:  Positive for abdominal pain.  As noted in HPI  Physical Exam Vital Signs  I have reviewed the triage vital signs BP (!) 163/104   Pulse 64   Temp 97.7 F (36.5 C) (Oral)   Resp 19   SpO2 97%   Physical Exam Vitals reviewed.  Constitutional:      General: He is not in acute distress.    Appearance: He is well-developed. He is not diaphoretic.  HENT:     Head: Normocephalic and atraumatic.     Right Ear: External ear normal.     Left Ear: External ear normal.     Nose: Nose normal.     Mouth/Throat:     Mouth: Mucous membranes are moist.  Eyes:     General: No scleral icterus.    Conjunctiva/sclera: Conjunctivae normal.  Neck:     Trachea: Phonation normal.  Cardiovascular:     Rate and Rhythm: Normal rate and regular rhythm.  Pulmonary:     Effort: Pulmonary effort is normal. No respiratory distress.     Breath sounds: No stridor.  Abdominal:     General: There is no distension.     Tenderness: There is abdominal tenderness in the left upper quadrant. There is no guarding or rebound.  Musculoskeletal:        General: Normal  range of motion.     Cervical back: Normal range of motion.  Neurological:     Mental Status: He is alert and oriented to person, place, and time.  Psychiatric:        Behavior: Behavior normal.    ED Results and Treatments Labs (all labs ordered are listed, but only abnormal results are displayed) Labs Reviewed  COMPREHENSIVE METABOLIC PANEL - Abnormal; Notable for the following components:      Result Value   Glucose, Bld 195 (*)    All other components within normal limits  CBC WITH DIFFERENTIAL/PLATELET - Abnormal; Notable for the following components:   WBC 13.0 (*)    Neutro Abs 10.0 (*)    All other components within normal limits  URINALYSIS, ROUTINE W REFLEX MICROSCOPIC - Abnormal; Notable for the following components:   APPearance HAZY (*)    Specific Gravity, Urine 1.035 (*)    Ketones, ur 5 (*)    Protein, ur 30 (*)    Leukocytes,Ua TRACE (*)    Bacteria, UA RARE (*)    All other components within normal limits  LIPASE, BLOOD                                                                                                                         EKG  EKG Interpretation  Date/Time:    Ventricular Rate:    PR Interval:    QRS Duration:   QT Interval:    QTC Calculation:   R Axis:     Text Interpretation:  Radiology CT Abdomen Pelvis W Contrast  Result Date: 10/01/2021 CLINICAL DATA:  Abdominal pain, acute, nonlocalized EXAM: CT ABDOMEN AND PELVIS WITH CONTRAST TECHNIQUE: Multidetector CT imaging of the abdomen and pelvis was performed using the standard protocol following bolus administration of intravenous contrast. RADIATION DOSE REDUCTION: This exam was performed according to the departmental dose-optimization program which includes automated exposure control, adjustment of the mA and/or kV according to patient size and/or use of iterative reconstruction technique. CONTRAST:  172m OMNIPAQUE IOHEXOL 300 MG/ML  SOLN COMPARISON:  CT 07/29/2021 FINDINGS:  Lower chest: No acute abnormality. Hepatobiliary: Diffuse hepatic steatosis.  Prior cholecystectomy. Pancreas: Unremarkable. No pancreatic ductal dilatation or surrounding inflammatory changes. Spleen: Normal in size with small adjacent splenule. Adrenals/Urinary Tract: Adrenal glands are unremarkable. No hydronephrosis or nephrolithiasis. Minimal bladder distension. Stomach/Bowel: The stomach is within normal limits. There is no evidence of bowel obstruction.The appendix is normal. Vascular/Lymphatic: No significant vascular findings are present. No enlarged abdominal or pelvic lymph nodes. Reproductive: Unremarkable. Other: No abdominal wall hernia or abnormality. No abdominopelvic ascites. Musculoskeletal: No acute or significant osseous findings. IMPRESSION: No acute abdominopelvic abnormality.  Normal appendix. Hepatic steatosis. Electronically Signed   By: JMaurine SimmeringM.D.   On: 10/01/2021 12:23    Pertinent labs & imaging results that were available during my care of the patient were reviewed by me and considered in my medical decision making (see MDM for details).  Medications Ordered in ED Medications  metoCLOPramide (REGLAN) injection 10 mg (10 mg Intravenous Given 10/02/21 0240)  sodium chloride 0.9 % bolus 1,000 mL (0 mLs Intravenous Stopped 10/02/21 0443)  alum & mag hydroxide-simeth (MAALOX/MYLANTA) 200-200-20 MG/5ML suspension 30 mL (30 mLs Oral Given 10/02/21 0241)    And  lidocaine (XYLOCAINE) 2 % viscous mouth solution 15 mL (15 mLs Oral Given 10/02/21 0241)  hyoscyamine (LEVSIN SL) SL tablet 0.125 mg (0.125 mg Oral Given 10/02/21 0240)  metoCLOPramide (REGLAN) tablet 10 mg (10 mg Oral Given 10/02/21 0450)                                                                                                                                     Procedures Procedures  (including critical care time)  Medical Decision Making / ED Course    Complexity of Problem:  Co-morbidities/SDOH that  complicate the patient evaluation/care: Noted above in HPI  Patient's presenting problem/concern, DDX, and MDM listed below: Abdominal pain with nausea and vomiting Likely consistent with gastroparesis flare We will recheck labs to rule out any electrolyte, metabolic derangements or DKA. We will treat symptomatically and reassess Do not feel that patient requires repeat imaging at this time  Hospitalization Considered:  Yes if he is unable to tolerate p.o. after several rounds of treatment  Initial Intervention:  IV fluids, Reglan, Levsin, GI cocktail    Complexity of Data:   Laboratory Tests ordered listed below with my independent interpretation: CBC with leukocytosis.  No anemia. Hyperglycemia without evidence of DKA.  No electrolyte derangements or renal sufficiency. UA not suspicious for infection   Imaging Studies ordered listed below with my independent interpretation: Not indicated     ED Course:    Assessment, Add'l Intervention, and Reassessment: Abdominal pain with nausea and vomiting Consistent with gastroparesis flare Work-up is reassuring. Treated symptomatically and patient was able to tolerate p.o. intake.  He does not require admission to the hospital and is able to be discharged home.    Final Clinical Impression(s) / ED Diagnoses Final diagnoses:  Nausea and vomiting in adult  LUQ abdominal pain   The patient appears reasonably screened and/or stabilized for discharge and I doubt any other medical condition or other [facility] requiring further screening, evaluation, or treatment in the ED at this time prior to discharge. Safe for discharge with strict return precautions.  Disposition: Discharge  Condition: Good  I have discussed the results, Dx and Tx plan with the patient/family who expressed understanding and agree(s) with the plan. Discharge instructions discussed at length. The patient/family was given strict return precautions who verbalized  understanding of the instructions. No further questions at time of discharge.    ED Discharge Orders          Ordered    Hyoscyamine Sulfate SL (LEVSIN/SL) 0.125 MG SUBL  4 times daily PRN        10/02/21 0541    metoCLOPramide (REGLAN) 10 MG tablet  Every 8 hours PRN        10/02/21 0541    Metoclopramide HCl 10 MG TBDP  3 times daily PRN        10/02/21 0541            Follow Up: [facility] [address] [place]  [id number] [phone]  Call  to schedule an appointment for close follow up           This chart was dictated using voice recognition software.  Despite best efforts to proofread,  errors can occur which can change the documentation meaning.    Fatima Blank, MD 10/02/21 [phone]

## 2021-10-02 NOTE — ED Triage Notes (Signed)
Pt reports with abdominal pain along with nausea. Pt states that he was seen yesterday but the nausea medication prescribed isn't helping.

## 2021-10-03 ENCOUNTER — Encounter: Payer: Self-pay | Admitting: Internal Medicine

## 2021-10-11 DIAGNOSIS — G894 Chronic pain syndrome: Secondary | ICD-10-CM | POA: Diagnosis not present

## 2021-11-05 DIAGNOSIS — G8929 Other chronic pain: Secondary | ICD-10-CM | POA: Diagnosis not present

## 2021-11-05 DIAGNOSIS — M545 Low back pain, unspecified: Secondary | ICD-10-CM | POA: Diagnosis not present

## 2021-11-05 DIAGNOSIS — Z79899 Other long term (current) drug therapy: Secondary | ICD-10-CM | POA: Diagnosis not present

## 2021-11-05 DIAGNOSIS — E119 Type 2 diabetes mellitus without complications: Secondary | ICD-10-CM | POA: Diagnosis not present

## 2021-11-05 DIAGNOSIS — R030 Elevated blood-pressure reading, without diagnosis of hypertension: Secondary | ICD-10-CM | POA: Diagnosis not present

## 2021-11-14 DIAGNOSIS — R1010 Upper abdominal pain, unspecified: Secondary | ICD-10-CM | POA: Diagnosis not present

## 2021-11-14 DIAGNOSIS — R112 Nausea with vomiting, unspecified: Secondary | ICD-10-CM | POA: Diagnosis not present

## 2021-11-14 DIAGNOSIS — Z1211 Encounter for screening for malignant neoplasm of colon: Secondary | ICD-10-CM | POA: Diagnosis not present

## 2021-11-14 DIAGNOSIS — R197 Diarrhea, unspecified: Secondary | ICD-10-CM | POA: Diagnosis not present

## 2021-11-27 DIAGNOSIS — M25561 Pain in right knee: Secondary | ICD-10-CM | POA: Diagnosis not present

## 2021-11-28 ENCOUNTER — Other Ambulatory Visit: Payer: Self-pay | Admitting: Orthopedic Surgery

## 2021-11-28 DIAGNOSIS — M25561 Pain in right knee: Secondary | ICD-10-CM

## 2021-12-07 DIAGNOSIS — M545 Low back pain, unspecified: Secondary | ICD-10-CM | POA: Diagnosis not present

## 2021-12-07 DIAGNOSIS — M25569 Pain in unspecified knee: Secondary | ICD-10-CM | POA: Diagnosis not present

## 2021-12-07 DIAGNOSIS — E119 Type 2 diabetes mellitus without complications: Secondary | ICD-10-CM | POA: Diagnosis not present

## 2021-12-07 DIAGNOSIS — R030 Elevated blood-pressure reading, without diagnosis of hypertension: Secondary | ICD-10-CM | POA: Diagnosis not present

## 2021-12-07 DIAGNOSIS — Z789 Other specified health status: Secondary | ICD-10-CM | POA: Diagnosis not present

## 2021-12-07 DIAGNOSIS — Z79899 Other long term (current) drug therapy: Secondary | ICD-10-CM | POA: Diagnosis not present

## 2021-12-08 ENCOUNTER — Other Ambulatory Visit: Payer: Medicare Other

## 2021-12-14 ENCOUNTER — Inpatient Hospital Stay: Admission: RE | Admit: 2021-12-14 | Payer: Medicare Other | Source: Ambulatory Visit

## 2021-12-26 ENCOUNTER — Emergency Department (HOSPITAL_COMMUNITY)
Admission: EM | Admit: 2021-12-26 | Discharge: 2021-12-27 | Disposition: A | Discharge status: Home / Self Care | Payer: Medicare Other | Attending: Emergency Medicine | Admitting: Emergency Medicine

## 2021-12-26 ENCOUNTER — Encounter (HOSPITAL_COMMUNITY): Payer: Self-pay

## 2021-12-26 DIAGNOSIS — R1011 Right upper quadrant pain: Secondary | ICD-10-CM | POA: Diagnosis not present

## 2021-12-26 DIAGNOSIS — Z5321 Procedure and treatment not carried out due to patient leaving prior to being seen by health care provider: Secondary | ICD-10-CM | POA: Insufficient documentation

## 2021-12-26 DIAGNOSIS — R112 Nausea with vomiting, unspecified: Secondary | ICD-10-CM | POA: Diagnosis not present

## 2021-12-26 DIAGNOSIS — R109 Unspecified abdominal pain: Secondary | ICD-10-CM | POA: Diagnosis not present

## 2021-12-26 LAB — COMPREHENSIVE METABOLIC PANEL
ALT: 27 U/L (ref 0–44)
AST: 21 U/L (ref 15–41)
Albumin: 4.7 g/dL (ref 3.5–5.0)
Alkaline Phosphatase: 98 U/L (ref 38–126)
Anion gap: 10 (ref 5–15)
BUN: 18 mg/dL (ref 6–20)
CO2: 26 mmol/L (ref 22–32)
Calcium: 9.8 mg/dL (ref 8.9–10.3)
Chloride: 102 mmol/L (ref 98–111)
Creatinine, Ser: 0.86 mg/dL (ref 0.61–1.24)
GFR, Estimated: >60 mL/min (ref >60)
Glucose, Bld: 157 mg/dL — ABNORMAL HIGH (ref 70–99)
Potassium: 4 mmol/L (ref 3.5–5.1)
Sodium: 138 mmol/L (ref 135–145)
Total Bilirubin: 1.4 mg/dL — ABNORMAL HIGH (ref 0.3–1.2)
Total Protein: 8.2 g/dL — ABNORMAL HIGH (ref 6.5–8.1)

## 2021-12-26 LAB — CBC WITH DIFFERENTIAL/PLATELET
Abs Immature Granulocytes: 0.07 10*3/uL (ref 0.00–0.07)
Basophils Absolute: 0.1 10*3/uL (ref 0.0–0.1)
Basophils Relative: 0 %
Eosinophils Absolute: 0 10*3/uL (ref 0.0–0.5)
Eosinophils Relative: 0 %
HCT: 46.5 % (ref 39.0–52.0)
Hemoglobin: 15.6 g/dL (ref 13.0–17.0)
Immature Granulocytes: 1 %
Lymphocytes Relative: 12 %
Lymphs Abs: 1.7 10*3/uL (ref 0.7–4.0)
MCH: 29.7 pg (ref 26.0–34.0)
MCHC: 33.5 g/dL (ref 30.0–36.0)
MCV: 88.6 fL (ref 80.0–100.0)
Monocytes Absolute: 0.6 10*3/uL (ref 0.1–1.0)
Monocytes Relative: 4 %
Neutro Abs: 12.3 10*3/uL — ABNORMAL HIGH (ref 1.7–7.7)
Neutrophils Relative %: 83 %
Platelets: 242 10*3/uL (ref 150–400)
RBC: 5.25 MIL/uL (ref 4.22–5.81)
RDW: 12.3 % (ref 11.5–15.5)
WBC: 14.7 10*3/uL — ABNORMAL HIGH (ref 4.0–10.5)
nRBC: 0 % (ref 0.0–0.2)

## 2021-12-26 LAB — LIPASE, BLOOD: Lipase: 26 U/L (ref 11–51)

## 2021-12-26 MED ORDER — ONDANSETRON 8 MG PO TBDP
8.0000 mg | ORAL_TABLET | Freq: Once | ORAL | Status: AC
  Administered 2021-12-26: 8 mg via ORAL
  Filled 2021-12-26: qty 1

## 2021-12-26 NOTE — ED Triage Notes (Signed)
Pt c/o abdominal pain, nausea, and vomiting over the past 4 days.

## 2021-12-26 NOTE — ED Provider Triage Note (Signed)
Emergency Medicine Provider Triage Evaluation Note  Todd Rose , a 46 y.o. male  was evaluated in triage.  Pt complains of abdominal pain.  Patient states that he had intermittent episodes of nausea and vomiting since his recent emergency department visit at the end of May.  His CT abdomen at that time was relatively unremarkable.  He was given Reglan emergency department and noticed significant improvement of his symptoms.  He followed up with his family doctor who did not continue the medicine.  Family doctor referred him to gastroenterology who he has appointment with at the end of the month for an endoscopy and colonoscopy to evaluate his symptoms.  He states that this episode of nausea and vomiting has been for the past 4 days.  Denies hematemesis, fever, chest pain, shortness of breath, urinary symptoms, change in bowel habits..  States that he has been taking his diabetes medicines as prescribed.  Denies alcohol use.  States that he has not used marijuana for the past month.  Past abdominal surgery significant for cholecystectomy per patient.  Review of Systems  Positive: See above Negative:   Physical Exam  BP (!) 150/118   Pulse (!) 107   Temp 97.7 F (36.5 C)   Resp 18   Ht 5' 7"  (1.702 m)   Wt 107 kg   SpO2 99%   BMI 36.96 kg/m  Gen:   Awake, no distress   Resp:  Normal effort  MSK:   Moves extremities without difficulty  Other:  Mild epigastric/right upper quadrant tenderness on exam.  Medical Decision Making  Medically screening exam initiated at 4:54 PM.  Appropriate orders placed.  Obinna Elsey was informed that the remainder of the evaluation will be completed by another provider, this initial triage assessment does not replace that evaluation, and the importance of remaining in the ED until their evaluation is complete.     Wilnette Kales, [place] 12/26/21 [phone]

## 2021-12-27 DIAGNOSIS — K5730 Diverticulosis of large intestine without perforation or abscess without bleeding: Secondary | ICD-10-CM | POA: Diagnosis not present

## 2021-12-27 DIAGNOSIS — R1110 Vomiting, unspecified: Secondary | ICD-10-CM | POA: Diagnosis not present

## 2021-12-27 DIAGNOSIS — I700 Atherosclerosis of aorta: Secondary | ICD-10-CM | POA: Diagnosis not present

## 2021-12-27 DIAGNOSIS — R1084 Generalized abdominal pain: Secondary | ICD-10-CM | POA: Diagnosis not present

## 2021-12-27 DIAGNOSIS — R109 Unspecified abdominal pain: Secondary | ICD-10-CM | POA: Diagnosis not present

## 2021-12-27 DIAGNOSIS — I498 Other specified cardiac arrhythmias: Secondary | ICD-10-CM | POA: Diagnosis not present

## 2021-12-27 DIAGNOSIS — I517 Cardiomegaly: Secondary | ICD-10-CM | POA: Diagnosis not present

## 2021-12-27 DIAGNOSIS — R112 Nausea with vomiting, unspecified: Secondary | ICD-10-CM | POA: Diagnosis not present

## 2021-12-27 DIAGNOSIS — K760 Fatty (change of) liver, not elsewhere classified: Secondary | ICD-10-CM | POA: Diagnosis not present

## 2021-12-27 DIAGNOSIS — K219 Gastro-esophageal reflux disease without esophagitis: Secondary | ICD-10-CM | POA: Diagnosis not present

## 2021-12-27 DIAGNOSIS — R197 Diarrhea, unspecified: Secondary | ICD-10-CM | POA: Diagnosis not present

## 2021-12-28 DIAGNOSIS — I498 Other specified cardiac arrhythmias: Secondary | ICD-10-CM | POA: Diagnosis not present

## 2022-01-05 DIAGNOSIS — E119 Type 2 diabetes mellitus without complications: Secondary | ICD-10-CM | POA: Diagnosis not present

## 2022-01-05 DIAGNOSIS — M545 Low back pain, unspecified: Secondary | ICD-10-CM | POA: Diagnosis not present

## 2022-01-05 DIAGNOSIS — M25569 Pain in unspecified knee: Secondary | ICD-10-CM | POA: Diagnosis not present

## 2022-01-05 DIAGNOSIS — Z79899 Other long term (current) drug therapy: Secondary | ICD-10-CM | POA: Diagnosis not present

## 2022-01-05 DIAGNOSIS — R030 Elevated blood-pressure reading, without diagnosis of hypertension: Secondary | ICD-10-CM | POA: Diagnosis not present

## 2022-01-05 DIAGNOSIS — Z0130 Encounter for examination of blood pressure without abnormal findings: Secondary | ICD-10-CM | POA: Diagnosis not present

## 2022-01-05 DIAGNOSIS — G8929 Other chronic pain: Secondary | ICD-10-CM | POA: Diagnosis not present

## 2022-01-05 DIAGNOSIS — Z789 Other specified health status: Secondary | ICD-10-CM | POA: Diagnosis not present

## 2022-01-10 DIAGNOSIS — Z1211 Encounter for screening for malignant neoplasm of colon: Secondary | ICD-10-CM | POA: Diagnosis not present

## 2022-01-10 DIAGNOSIS — R1013 Epigastric pain: Secondary | ICD-10-CM | POA: Diagnosis not present

## 2022-01-10 DIAGNOSIS — K648 Other hemorrhoids: Secondary | ICD-10-CM | POA: Diagnosis not present

## 2022-01-10 DIAGNOSIS — K649 Unspecified hemorrhoids: Secondary | ICD-10-CM | POA: Diagnosis not present

## 2022-01-10 DIAGNOSIS — E119 Type 2 diabetes mellitus without complications: Secondary | ICD-10-CM | POA: Diagnosis not present

## 2022-01-10 DIAGNOSIS — R112 Nausea with vomiting, unspecified: Secondary | ICD-10-CM | POA: Diagnosis not present

## 2022-01-10 DIAGNOSIS — K2950 Unspecified chronic gastritis without bleeding: Secondary | ICD-10-CM | POA: Diagnosis not present

## 2022-01-30 DIAGNOSIS — R1013 Epigastric pain: Secondary | ICD-10-CM | POA: Diagnosis not present

## 2022-01-30 DIAGNOSIS — R634 Abnormal weight loss: Secondary | ICD-10-CM | POA: Diagnosis not present

## 2022-01-30 DIAGNOSIS — R110 Nausea: Secondary | ICD-10-CM | POA: Diagnosis not present

## 2022-02-05 ENCOUNTER — Other Ambulatory Visit: Payer: Self-pay

## 2022-02-05 ENCOUNTER — Emergency Department (HOSPITAL_BASED_OUTPATIENT_CLINIC_OR_DEPARTMENT_OTHER): Payer: Medicare Other

## 2022-02-05 ENCOUNTER — Emergency Department (HOSPITAL_BASED_OUTPATIENT_CLINIC_OR_DEPARTMENT_OTHER)
Admission: EM | Admit: 2022-02-05 | Discharge: 2022-02-05 | Disposition: A | Discharge status: Home / Self Care | Payer: Medicare Other | Attending: Emergency Medicine | Admitting: Emergency Medicine

## 2022-02-05 ENCOUNTER — Encounter (HOSPITAL_BASED_OUTPATIENT_CLINIC_OR_DEPARTMENT_OTHER): Payer: Self-pay | Admitting: Emergency Medicine

## 2022-02-05 DIAGNOSIS — R1114 Bilious vomiting: Secondary | ICD-10-CM | POA: Insufficient documentation

## 2022-02-05 DIAGNOSIS — R410 Disorientation, unspecified: Secondary | ICD-10-CM | POA: Insufficient documentation

## 2022-02-05 DIAGNOSIS — R030 Elevated blood-pressure reading, without diagnosis of hypertension: Secondary | ICD-10-CM | POA: Diagnosis not present

## 2022-02-05 DIAGNOSIS — R1013 Epigastric pain: Secondary | ICD-10-CM | POA: Insufficient documentation

## 2022-02-05 DIAGNOSIS — F606 Avoidant personality disorder: Secondary | ICD-10-CM | POA: Diagnosis not present

## 2022-02-05 DIAGNOSIS — R1115 Cyclical vomiting syndrome unrelated to migraine: Secondary | ICD-10-CM | POA: Diagnosis not present

## 2022-02-05 DIAGNOSIS — I700 Atherosclerosis of aorta: Secondary | ICD-10-CM | POA: Diagnosis not present

## 2022-02-05 DIAGNOSIS — Z0130 Encounter for examination of blood pressure without abnormal findings: Secondary | ICD-10-CM | POA: Diagnosis not present

## 2022-02-05 DIAGNOSIS — M25569 Pain in unspecified knee: Secondary | ICD-10-CM | POA: Diagnosis not present

## 2022-02-05 DIAGNOSIS — Z79899 Other long term (current) drug therapy: Secondary | ICD-10-CM | POA: Diagnosis not present

## 2022-02-05 DIAGNOSIS — Z789 Other specified health status: Secondary | ICD-10-CM | POA: Diagnosis not present

## 2022-02-05 DIAGNOSIS — M545 Low back pain, unspecified: Secondary | ICD-10-CM | POA: Diagnosis not present

## 2022-02-05 DIAGNOSIS — E119 Type 2 diabetes mellitus without complications: Secondary | ICD-10-CM | POA: Diagnosis not present

## 2022-02-05 DIAGNOSIS — G8929 Other chronic pain: Secondary | ICD-10-CM | POA: Diagnosis not present

## 2022-02-05 DIAGNOSIS — R109 Unspecified abdominal pain: Secondary | ICD-10-CM | POA: Diagnosis not present

## 2022-02-05 LAB — COMPREHENSIVE METABOLIC PANEL
ALT: 16 U/L (ref 0–44)
AST: 14 U/L — ABNORMAL LOW (ref 15–41)
Albumin: 4.4 g/dL (ref 3.5–5.0)
Alkaline Phosphatase: 86 U/L (ref 38–126)
Anion gap: 10 (ref 5–15)
BUN: 14 mg/dL (ref 6–20)
CO2: 29 mmol/L (ref 22–32)
Calcium: 9.4 mg/dL (ref 8.9–10.3)
Chloride: 101 mmol/L (ref 98–111)
Creatinine, Ser: 0.78 mg/dL (ref 0.61–1.24)
GFR, Estimated: >60 mL/min (ref >60)
Glucose, Bld: 156 mg/dL — ABNORMAL HIGH (ref 70–99)
Potassium: 4.1 mmol/L (ref 3.5–5.1)
Sodium: 140 mmol/L (ref 135–145)
Total Bilirubin: 0.9 mg/dL (ref 0.3–1.2)
Total Protein: 7 g/dL (ref 6.5–8.1)

## 2022-02-05 LAB — URINALYSIS, ROUTINE W REFLEX MICROSCOPIC
Bilirubin Urine: NEGATIVE
Glucose, UA: NEGATIVE mg/dL
Hgb urine dipstick: NEGATIVE
Ketones, ur: NEGATIVE mg/dL
Leukocytes,Ua: NEGATIVE
Nitrite: NEGATIVE
Protein, ur: NEGATIVE mg/dL
Specific Gravity, Urine: 1.02 (ref 1.005–1.030)
pH: 7.5 (ref 5.0–8.0)

## 2022-02-05 LAB — CBC
HCT: 39.1 % (ref 39.0–52.0)
Hemoglobin: 13.2 g/dL (ref 13.0–17.0)
MCH: 30 pg (ref 26.0–34.0)
MCHC: 33.8 g/dL (ref 30.0–36.0)
MCV: 88.9 fL (ref 80.0–100.0)
Platelets: 209 10*3/uL (ref 150–400)
RBC: 4.4 MIL/uL (ref 4.22–5.81)
RDW: 12.6 % (ref 11.5–15.5)
WBC: 9.1 10*3/uL (ref 4.0–10.5)
nRBC: 0 % (ref 0.0–0.2)

## 2022-02-05 LAB — LIPASE, BLOOD: Lipase: <10 U/L — ABNORMAL LOW (ref 11–51)

## 2022-02-05 LAB — TROPONIN I (HIGH SENSITIVITY): Troponin I (High Sensitivity): <2 ng/L (ref <18)

## 2022-02-05 MED ORDER — IOHEXOL 300 MG/ML  SOLN
100.0000 mL | Freq: Once | INTRAMUSCULAR | Status: AC | PRN
  Administered 2022-02-05: 85 mL via INTRAVENOUS

## 2022-02-05 MED ORDER — ONDANSETRON HCL 4 MG/2ML IJ SOLN
4.0000 mg | Freq: Once | INTRAMUSCULAR | Status: AC | PRN
Start: 2022-02-05 — End: 2022-02-05
  Administered 2022-02-05: 4 mg via INTRAVENOUS
  Filled 2022-02-05: qty 2

## 2022-02-05 MED ORDER — MORPHINE SULFATE (PF) 2 MG/ML IV SOLN
2.0000 mg | Freq: Once | INTRAVENOUS | Status: AC
  Administered 2022-02-05: 2 mg via INTRAVENOUS
  Filled 2022-02-05: qty 1

## 2022-02-05 MED ORDER — LACTATED RINGERS IV BOLUS
500.0000 mL | Freq: Once | INTRAVENOUS | Status: AC
  Administered 2022-02-05: 500 mL via INTRAVENOUS

## 2022-02-05 MED ORDER — ONDANSETRON HCL 4 MG/2ML IJ SOLN
4.0000 mg | Freq: Once | INTRAMUSCULAR | Status: AC
  Administered 2022-02-05: 4 mg via INTRAVENOUS
  Filled 2022-02-05: qty 2

## 2022-02-05 NOTE — ED Provider Notes (Signed)
[facility] EMERGENCY DEPT Provider Note   CSN: [id number] Arrival date & time: 02/05/22  1030     History  Chief Complaint  Patient presents with   Abdominal Pain    Todd Rose is a 46 y.o. male.  HPI 46 year old male history of chronic abdominal pain with ongoing nausea and vomiting presents today with complaints of bilious vomiting, and abdominal pain.  He reports that he has had increased pain to the left upper side of his abdomen since colonoscopy several weeks ago.  He states he has had continued nausea and vomiting but this has not really changed.  He states that his main concern is his new pain that he describes as being in the epigastric region and pressure-like in nature.     Home Medications Prior to Admission medications   Medication Sig Start Date End Date Taking? Authorizing Provider  busPIRone (BUSPAR) 10 MG tablet Take 1 tablet (10 mg total) by mouth 2 (two) times daily. 05/21/21   Cox, Elnita Maxwell, MD  carvedilol (COREG) 12.5 MG tablet Take 12.5 mg by mouth 2 (two) times daily. 11/05/21   [provider]  cyclobenzaprine (FLEXERIL) 10 MG tablet Take 1 tablet (10 mg total) by mouth 3 (three) times daily as needed for muscle spasms. 06/04/21   Lillard Anes, MD  gabapentin (NEURONTIN) 800 MG tablet Take 1 tablet (800 mg total) by mouth in the morning, at noon, in the evening, and at bedtime. 06/04/21   Lillard Anes, MD  hydrochlorothiazide (HYDRODIURIL) 25 MG tablet Take 25 mg by mouth daily. 10/15/21   [provider]  Hyoscyamine Sulfate SL (LEVSIN/SL) 0.125 MG SUBL Place 1 each under the tongue 4 (four) times daily as needed for up to 5 days. 10/02/21 10/07/21  Fatima Blank, MD  ibuprofen (ADVIL) 200 MG tablet Take 200 mg by mouth every 6 (six) hours as needed for moderate pain.  01/02/20   [provider]  lisinopril (ZESTRIL) 40 MG tablet Take 1 tablet by mouth once daily 12/07/20   Lillard Anes, MD   meclizine (ANTIVERT) 25 MG tablet Take 1 tablet (25 mg total) by mouth 3 (three) times daily as needed for dizziness. 12/25/20   Lillard Anes, MD  metoCLOPramide (REGLAN) 10 MG tablet Take 1 tablet (10 mg total) by mouth every 8 (eight) hours as needed for nausea. 10/02/21   Cardama, Grayce Sessions, MD  Metoclopramide HCl 10 MG TBDP Take 1 tablet (10 mg total) by mouth 3 (three) times daily as needed (nausea/vomiting). 10/02/21   Fatima Blank, MD  metoprolol tartrate (LOPRESSOR) 100 MG tablet Take 1 tablet (100 mg total) by mouth once for 1 dose. 2 hours before ct. 04/13/20 01/08/21  Tobb, Godfrey Pick, DO  naloxone (NARCAN) nasal spray 4 mg/0.1 mL Place into the nose. 01/30/21   [provider]  omeprazole (PRILOSEC) 40 MG capsule Take 1 capsule (40 mg total) by mouth daily. 10/01/21   Hayden Rasmussen, MD  ondansetron (ZOFRAN) 4 MG tablet Take 1 tablet (4 mg total) by mouth every 8 (eight) hours as needed for nausea or vomiting. 10/01/21   Hayden Rasmussen, MD  pantoprazole (PROTONIX) 40 MG tablet Take 40 mg by mouth 2 (two) times daily. 02/01/22   [provider]  pravastatin (PRAVACHOL) 40 MG tablet Take 1 tablet by mouth once daily 08/06/21   Lillard Anes, MD  torsemide (DEMADEX) 5 MG tablet Take 5 mg by mouth daily. 11/06/21   [provider]  valsartan (DIOVAN) 160 MG tablet Take 160 mg by mouth daily. 01/25/22   [provider]      Allergies    Naproxen and Tramadol    Review of Systems   Review of Systems  Physical Exam Updated Vital Signs BP (!) 159/100   Pulse 70   Temp 97.9 F (36.6 C) (Oral)   Resp 14   Ht 1.702 m (5' 7" )   Wt 107 kg   SpO2 100%   BMI 36.96 kg/m  Physical Exam Vitals and nursing note reviewed.  Constitutional:      General: He is in acute distress.     Appearance: He is well-developed. He is not ill-appearing.  HENT:     Head: Normocephalic.     Mouth/Throat:     Mouth: Mucous membranes are moist.   Eyes:     Extraocular Movements: Extraocular movements intact.  Cardiovascular:     Rate and Rhythm: Normal rate and regular rhythm.     Heart sounds: Normal heart sounds.  Pulmonary:     Effort: Pulmonary effort is normal.     Breath sounds: Normal breath sounds.  Abdominal:     General: Abdomen is flat. Bowel sounds are normal.     Palpations: Abdomen is soft.     Tenderness: There is abdominal tenderness in the epigastric area.     Hernia: No hernia is present.  Skin:    General: Skin is warm.     Capillary Refill: Capillary refill takes less than 2 seconds.  Neurological:     General: No focal deficit present.     Mental Status: He is alert. He is disoriented.  Psychiatric:        Mood and Affect: Mood is anxious.     ED Results / Procedures / Treatments   Labs (all labs ordered are listed, but only abnormal results are displayed) Labs Reviewed  LIPASE, BLOOD - Abnormal; Notable for the following components:      Result Value   Lipase <10 (*)    All other components within normal limits  COMPREHENSIVE METABOLIC PANEL - Abnormal; Notable for the following components:   Glucose, Bld 156 (*)    AST 14 (*)    All other components within normal limits  CBC  URINALYSIS, ROUTINE W REFLEX MICROSCOPIC  TROPONIN I (HIGH SENSITIVITY)    EKG EKG Interpretation  Date/Time:  Tuesday February 05 2022 11:05:54 EDT Ventricular Rate:  50 PR Interval:  148 QRS Duration: 101 QT Interval:  419 QTC Calculation: 382 R Axis:   22 Text Interpretation: Sinus rhythm Non-specific ST-t changes Confirmed by Pattricia Boss [phone]) on 02/05/2022 11:15:31 AM  Radiology CT ABDOMEN PELVIS W CONTRAST  Result Date: 02/05/2022 CLINICAL DATA:  Abdominal pain. EXAM: CT ABDOMEN AND PELVIS WITH CONTRAST TECHNIQUE: Multidetector CT imaging of the abdomen and pelvis was performed using the standard protocol following bolus administration of intravenous contrast. RADIATION DOSE REDUCTION: This exam  was performed according to the departmental dose-optimization program which includes automated exposure control, adjustment of the mA and/or kV according to patient size and/or use of iterative reconstruction technique. CONTRAST:  75m OMNIPAQUE IOHEXOL 300 MG/ML  SOLN COMPARISON:  10/01/2021 FINDINGS: Lower chest: No acute abnormality. Hepatobiliary: No focal liver abnormality is seen. Status post cholecystectomy. No biliary dilatation. Pancreas: Unremarkable. No pancreatic ductal dilatation or surrounding inflammatory changes. Spleen: Upper limits of normal measuring 13 cm cranial caudal. No focal splenic lesion. Adrenals/Urinary Tract: Normal adrenal glands. No signs of nephrolithiasis, hydronephrosis  or kidney mass. Urinary bladder is unremarkable. Stomach/Bowel: Stomach appears normal. The appendix is visualized and is within normal limits. No bowel wall thickening, inflammation, or distension. Vascular/Lymphatic: Aortic atherosclerosis. No aneurysm. No signs of abdominopelvic adenopathy. Reproductive: Prostate is unremarkable. Other: There is no free fluid or fluid collections identified. No signs of pneumoperitoneum. Musculoskeletal: No acute or suspicious osseous findings. IMPRESSION: 1. No acute findings within the abdomen or pelvis. 2. No signs of bowel perforation to explain patient's severe pain status post colonoscopy. 3. Status post cholecystectomy. Electronically Signed   By: Kerby Moors M.D.   On: 02/05/2022 12:27    Procedures Procedures    Medications Ordered in ED Medications  ondansetron (ZOFRAN) injection 4 mg (4 mg Intravenous Given 02/05/22 1122)  lactated ringers bolus 500 mL (500 mLs Intravenous New Bag/Given 02/05/22 1156)  ondansetron (ZOFRAN) injection 4 mg (4 mg Intravenous Given 02/05/22 1154)  morphine (PF) 2 MG/ML injection 2 mg (2 mg Intravenous Given 02/05/22 1155)  iohexol (OMNIPAQUE) 300 MG/ML solution 100 mL (85 mLs Intravenous Contrast Given 02/05/22 1207)    ED  Course/ Medical Decision Making/ A&P Clinical Course as of 02/05/22 1310  Tue Feb 05, 2022  1300 CBC reviewed interpreted within normal limits [DR]  1300 Creatinine: 8.87 Complete metabolic panel is reviewed and interpreted significant for glucose elevated at 156 no evidence of DKA noted [DR]  1300 Bilirubin Urine: NEGATIVE Urinalysis obtained, reviewed interpreted and normal [DR]  1300 Lipase, blood(!) Paced obtained reviewed interpreted and normal [DR]  1300 CT abdomen pelvis reviewed and interpreted no acute findings noted within the abdomen or pelvis this postcholecystectomy [DR]    Clinical Course User Index [DR] Pattricia Boss, MD                           Medical Decision Making 46 year old male with chronic abdominal pain and cyclical vomiting syndrome presents today with complaints of increased pain in his left epigastric region after colonoscopy.  Patient was evaluated here with labs which were all within normal limits, CT which shows no evidence of acute abnormality.  Patient is hypertensive.  He has not been able to keep his medications down.  He is treated here with fluids morphine and antiemetics  Amount and/or Complexity of Data Reviewed Labs: ordered. Radiology: ordered.  Risk Prescription drug management.          Final Clinical Impression(s) / ED Diagnoses Final diagnoses:  Cyclical vomiting syndrome  Epigastric pain    Rx / DC Orders ED Discharge Orders     None         Pattricia Boss, MD 02/05/22 1310

## 2022-02-05 NOTE — ED Notes (Signed)
Courtney RN removed pt's PIV per his request. This RN went to grab pt's dc paperwork and hew was no longer in the room. Pt left without receiving DC instructions. EDP Ray made aware

## 2022-02-05 NOTE — Discharge Instructions (Signed)
Please take home medications as prescribed. Follow up with your gi doctor this week.

## 2022-02-05 NOTE — ED Triage Notes (Signed)
Pt arrived POV. Pt reports he was at GI doctor this morning for follow up from having colonoscopy done approx 3 weeks ago and that they told him to come to the ER. Pt c/o epigastric abd pain that has been on and off since procedure but it has been worse today 10/10 pain. Pt restless in triage d/t pain.

## 2022-03-18 DIAGNOSIS — M545 Low back pain, unspecified: Secondary | ICD-10-CM | POA: Diagnosis not present

## 2022-03-18 DIAGNOSIS — Z789 Other specified health status: Secondary | ICD-10-CM | POA: Diagnosis not present

## 2022-03-18 DIAGNOSIS — R030 Elevated blood-pressure reading, without diagnosis of hypertension: Secondary | ICD-10-CM | POA: Diagnosis not present

## 2022-03-18 DIAGNOSIS — Z0130 Encounter for examination of blood pressure without abnormal findings: Secondary | ICD-10-CM | POA: Diagnosis not present

## 2022-03-18 DIAGNOSIS — Z8639 Personal history of other endocrine, nutritional and metabolic disease: Secondary | ICD-10-CM | POA: Diagnosis not present

## 2022-03-18 DIAGNOSIS — F17210 Nicotine dependence, cigarettes, uncomplicated: Secondary | ICD-10-CM | POA: Diagnosis not present

## 2022-03-18 DIAGNOSIS — G8929 Other chronic pain: Secondary | ICD-10-CM | POA: Diagnosis not present

## 2022-03-18 DIAGNOSIS — Z79899 Other long term (current) drug therapy: Secondary | ICD-10-CM | POA: Diagnosis not present

## 2022-03-18 DIAGNOSIS — M25569 Pain in unspecified knee: Secondary | ICD-10-CM | POA: Diagnosis not present

## 2022-03-18 DIAGNOSIS — R892 Abnormal level of other drugs, medicaments and biological substances in specimens from other organs, systems and tissues: Secondary | ICD-10-CM | POA: Diagnosis not present

## 2022-03-24 DIAGNOSIS — E119 Type 2 diabetes mellitus without complications: Secondary | ICD-10-CM | POA: Diagnosis not present

## 2022-03-24 DIAGNOSIS — M545 Low back pain, unspecified: Secondary | ICD-10-CM | POA: Diagnosis not present

## 2022-03-24 DIAGNOSIS — Z79899 Other long term (current) drug therapy: Secondary | ICD-10-CM | POA: Diagnosis not present

## 2022-03-24 DIAGNOSIS — K5900 Constipation, unspecified: Secondary | ICD-10-CM | POA: Diagnosis not present

## 2022-03-24 DIAGNOSIS — G8929 Other chronic pain: Secondary | ICD-10-CM | POA: Diagnosis not present

## 2022-03-24 DIAGNOSIS — F17210 Nicotine dependence, cigarettes, uncomplicated: Secondary | ICD-10-CM | POA: Diagnosis not present

## 2022-03-24 DIAGNOSIS — R892 Abnormal level of other drugs, medicaments and biological substances in specimens from other organs, systems and tissues: Secondary | ICD-10-CM | POA: Diagnosis not present

## 2022-03-24 DIAGNOSIS — Z0130 Encounter for examination of blood pressure without abnormal findings: Secondary | ICD-10-CM | POA: Diagnosis not present

## 2022-03-24 DIAGNOSIS — M25569 Pain in unspecified knee: Secondary | ICD-10-CM | POA: Diagnosis not present

## 2022-04-01 DIAGNOSIS — R892 Abnormal level of other drugs, medicaments and biological substances in specimens from other organs, systems and tissues: Secondary | ICD-10-CM | POA: Diagnosis not present

## 2022-04-01 DIAGNOSIS — M25569 Pain in unspecified knee: Secondary | ICD-10-CM | POA: Diagnosis not present

## 2022-04-01 DIAGNOSIS — R030 Elevated blood-pressure reading, without diagnosis of hypertension: Secondary | ICD-10-CM | POA: Diagnosis not present

## 2022-04-01 DIAGNOSIS — F17210 Nicotine dependence, cigarettes, uncomplicated: Secondary | ICD-10-CM | POA: Diagnosis not present

## 2022-04-01 DIAGNOSIS — E119 Type 2 diabetes mellitus without complications: Secondary | ICD-10-CM | POA: Diagnosis not present

## 2022-04-01 DIAGNOSIS — G8929 Other chronic pain: Secondary | ICD-10-CM | POA: Diagnosis not present

## 2022-04-01 DIAGNOSIS — Z789 Other specified health status: Secondary | ICD-10-CM | POA: Diagnosis not present

## 2022-04-01 DIAGNOSIS — M545 Low back pain, unspecified: Secondary | ICD-10-CM | POA: Diagnosis not present

## 2022-04-01 DIAGNOSIS — Z79899 Other long term (current) drug therapy: Secondary | ICD-10-CM | POA: Diagnosis not present

## 2022-04-06 DIAGNOSIS — R892 Abnormal level of other drugs, medicaments and biological substances in specimens from other organs, systems and tissues: Secondary | ICD-10-CM | POA: Diagnosis not present

## 2022-04-06 DIAGNOSIS — R030 Elevated blood-pressure reading, without diagnosis of hypertension: Secondary | ICD-10-CM | POA: Diagnosis not present

## 2022-04-06 DIAGNOSIS — M545 Low back pain, unspecified: Secondary | ICD-10-CM | POA: Diagnosis not present

## 2022-04-06 DIAGNOSIS — Z0130 Encounter for examination of blood pressure without abnormal findings: Secondary | ICD-10-CM | POA: Diagnosis not present

## 2022-04-06 DIAGNOSIS — E119 Type 2 diabetes mellitus without complications: Secondary | ICD-10-CM | POA: Diagnosis not present

## 2022-04-06 DIAGNOSIS — L03012 Cellulitis of left finger: Secondary | ICD-10-CM | POA: Diagnosis not present

## 2022-04-06 DIAGNOSIS — G8929 Other chronic pain: Secondary | ICD-10-CM | POA: Diagnosis not present

## 2022-04-06 DIAGNOSIS — I10 Essential (primary) hypertension: Secondary | ICD-10-CM | POA: Diagnosis not present

## 2022-04-06 DIAGNOSIS — M25569 Pain in unspecified knee: Secondary | ICD-10-CM | POA: Diagnosis not present

## 2022-04-06 DIAGNOSIS — Z79899 Other long term (current) drug therapy: Secondary | ICD-10-CM | POA: Diagnosis not present

## 2022-04-08 DIAGNOSIS — L03012 Cellulitis of left finger: Secondary | ICD-10-CM | POA: Diagnosis not present

## 2022-04-08 DIAGNOSIS — K219 Gastro-esophageal reflux disease without esophagitis: Secondary | ICD-10-CM | POA: Diagnosis not present

## 2022-04-08 DIAGNOSIS — E7800 Pure hypercholesterolemia, unspecified: Secondary | ICD-10-CM | POA: Diagnosis not present

## 2022-04-08 DIAGNOSIS — E119 Type 2 diabetes mellitus without complications: Secondary | ICD-10-CM | POA: Diagnosis not present

## 2022-04-08 DIAGNOSIS — I10 Essential (primary) hypertension: Secondary | ICD-10-CM | POA: Diagnosis not present

## 2022-05-06 DIAGNOSIS — R300 Dysuria: Secondary | ICD-10-CM | POA: Diagnosis not present

## 2022-05-06 DIAGNOSIS — R310 Gross hematuria: Secondary | ICD-10-CM | POA: Diagnosis not present

## 2022-05-06 DIAGNOSIS — E7800 Pure hypercholesterolemia, unspecified: Secondary | ICD-10-CM | POA: Diagnosis not present

## 2022-05-06 DIAGNOSIS — I10 Essential (primary) hypertension: Secondary | ICD-10-CM | POA: Diagnosis not present

## 2022-05-06 DIAGNOSIS — I700 Atherosclerosis of aorta: Secondary | ICD-10-CM | POA: Diagnosis not present

## 2022-05-06 DIAGNOSIS — N3001 Acute cystitis with hematuria: Secondary | ICD-10-CM | POA: Diagnosis not present

## 2022-05-06 DIAGNOSIS — R109 Unspecified abdominal pain: Secondary | ICD-10-CM | POA: Diagnosis not present

## 2022-05-06 DIAGNOSIS — N3090 Cystitis, unspecified without hematuria: Secondary | ICD-10-CM | POA: Diagnosis not present

## 2022-05-14 ENCOUNTER — Telehealth: Payer: Self-pay

## 2022-05-14 NOTE — Telephone Encounter (Signed)
     Patient  visit on [place]   at 12/25   Have you been able to follow up with your primary care physician? Yes   The patient was or was not able to obtain any needed medicine or equipment. Yes   Are there diet recommendations that you are having difficulty following? na  Patient expresses understanding of discharge instructions and education provided has no other needs at this time. Yes     [place], [facility], Care Management  [phone] [address], [place], [place] [address] Phone: [phone] Email: [email or web address]

## 2022-07-31 ENCOUNTER — Telehealth: Payer: Self-pay

## 2022-07-31 NOTE — Telephone Encounter (Signed)
        Patient  visited [place] on 3/15    Telephone encounter attempt :  1st Patient declined call    [place], [place] [phone] [address], [place], [place] [address] Phone: [phone] Email: [email or web address]

## 2023-09-28 IMAGING — CT CT RENAL STONE PROTOCOL
2 of 3 series · 16 of 46 positions shown, 18 images · non-contrast
Comparison: 11/01/2020

CLINICAL DATA: Right flank pain.  Kidney stones suspected.



[Series 3: renal stone 5.0 · axial · 0.96mm/px · z∈[+761,+1231]mm · 13 of 108 slices shown, 15 images]
[im 7/108  soft-tissue]
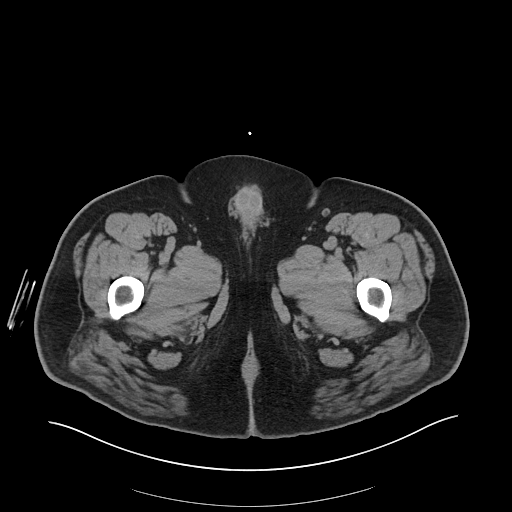
[im 7/108  bone]
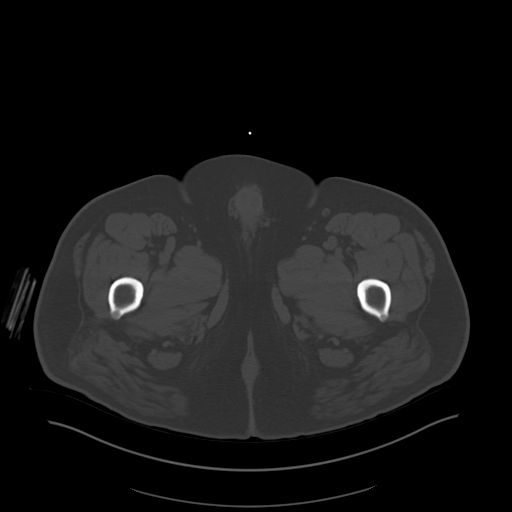
[im 14/108  soft-tissue]
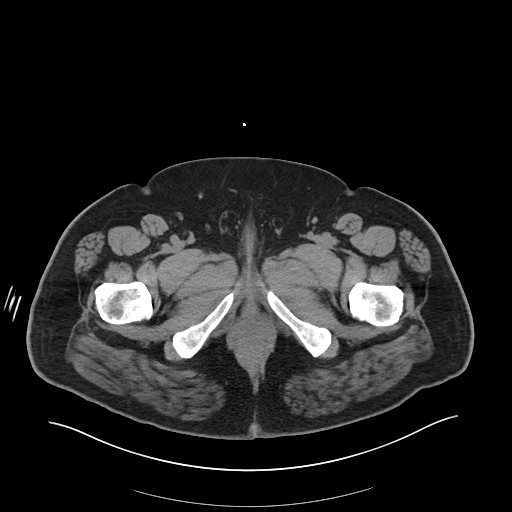
[im 21/108  soft-tissue]
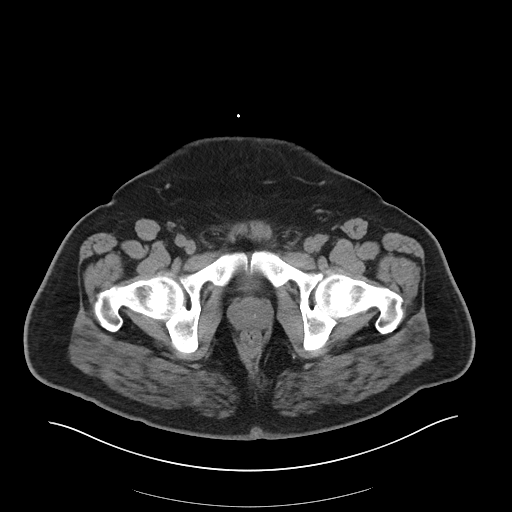
[im 32/108  soft-tissue]
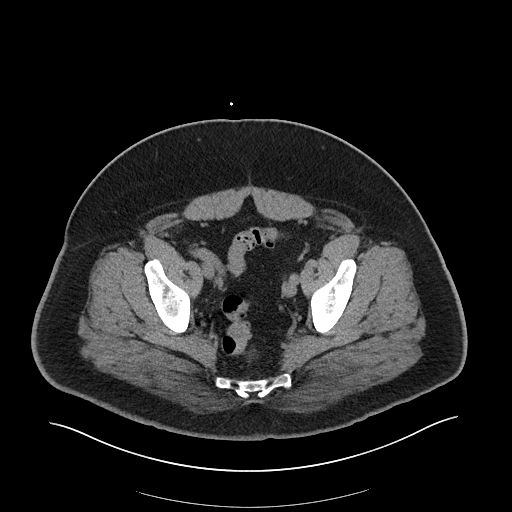
[im 38/108  soft-tissue]
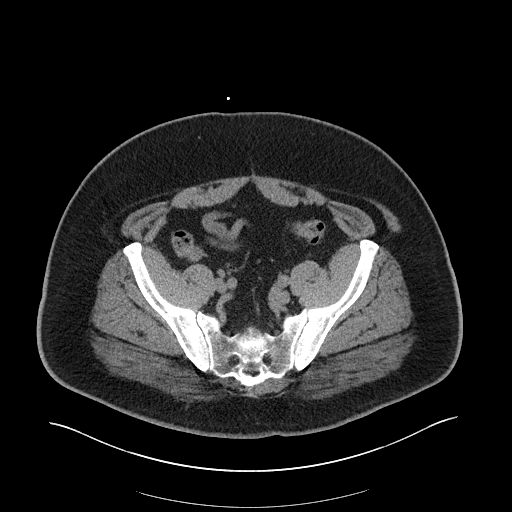
[im 45/108  soft-tissue]
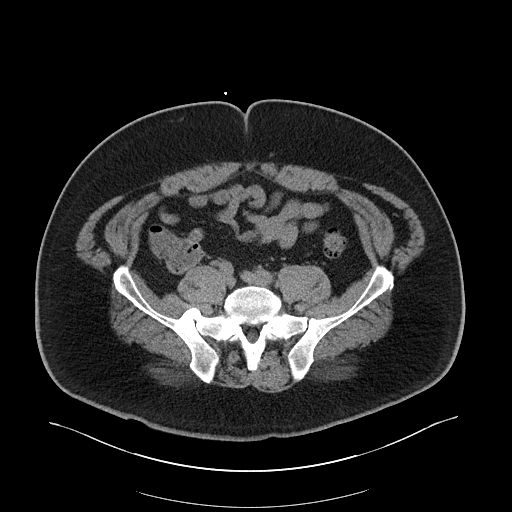
[im 56/108  soft-tissue]
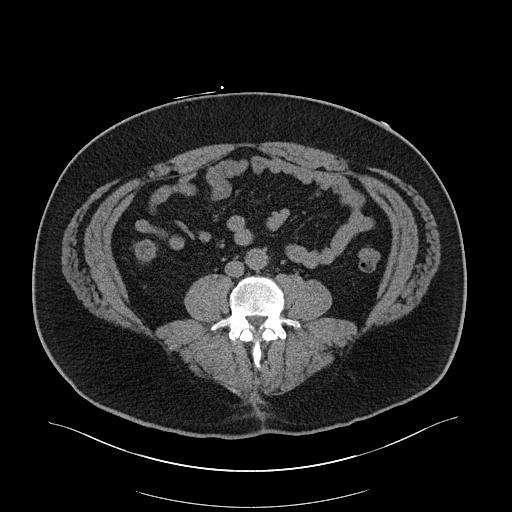
[im 63/108  soft-tissue]
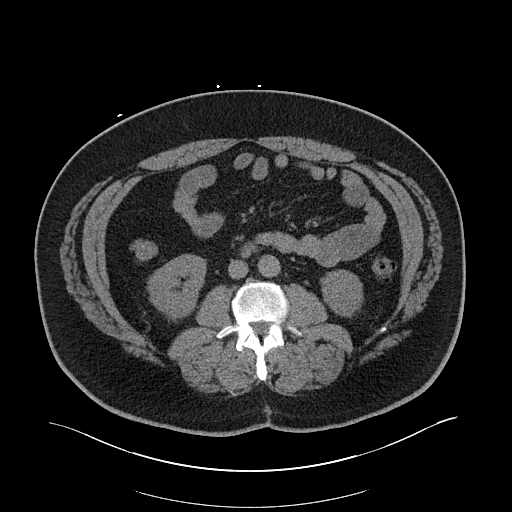
[im 70/108  soft-tissue]
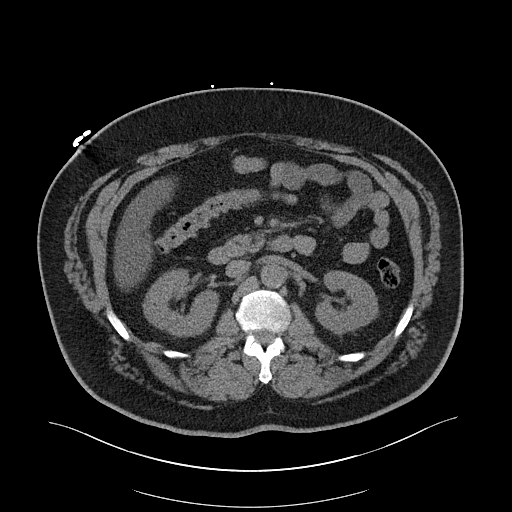
[im 70/108  bone]
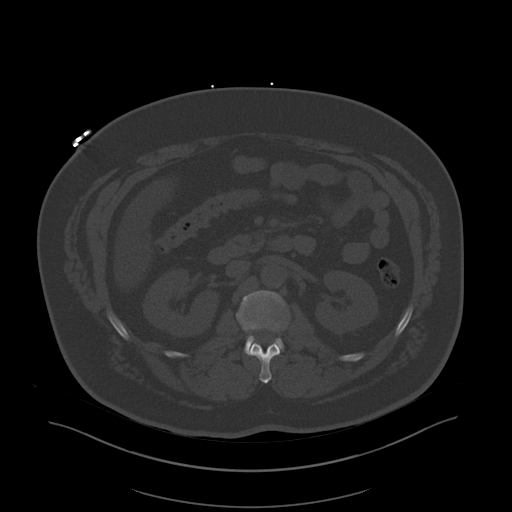
[im 76/108  soft-tissue]
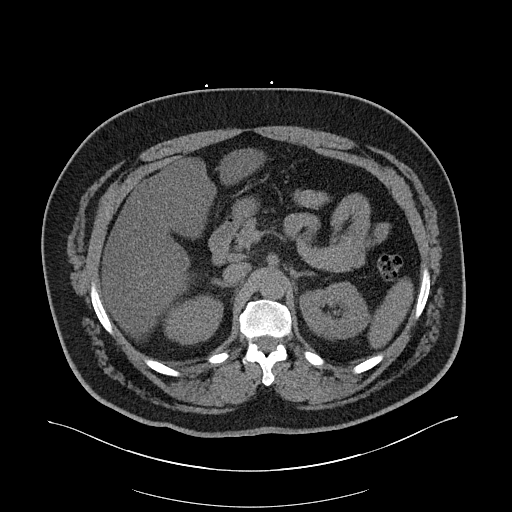
[im 87/108  soft-tissue]
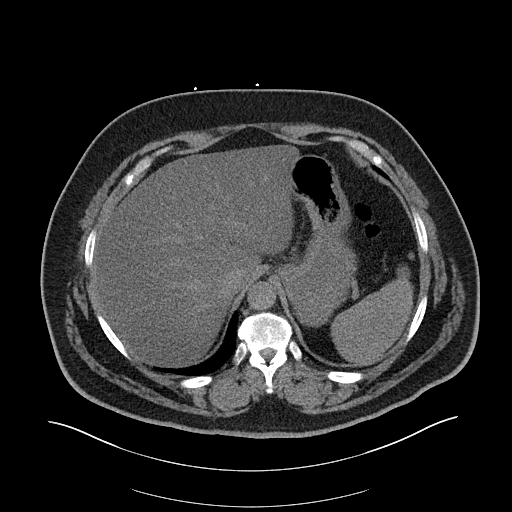
[im 94/108  soft-tissue]
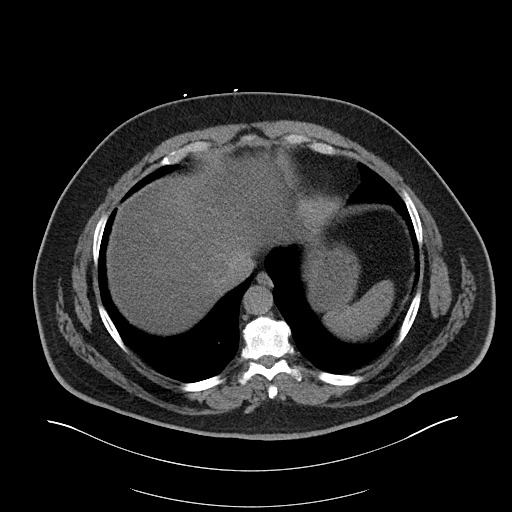
[im 101/108  soft-tissue]
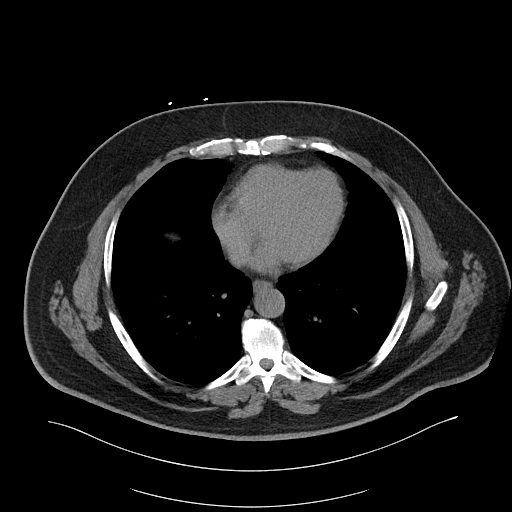

[Series 6: cor · coronal · 1.05mm/px · 3 of 213 slices shown]
[im 71/213  soft-tissue]
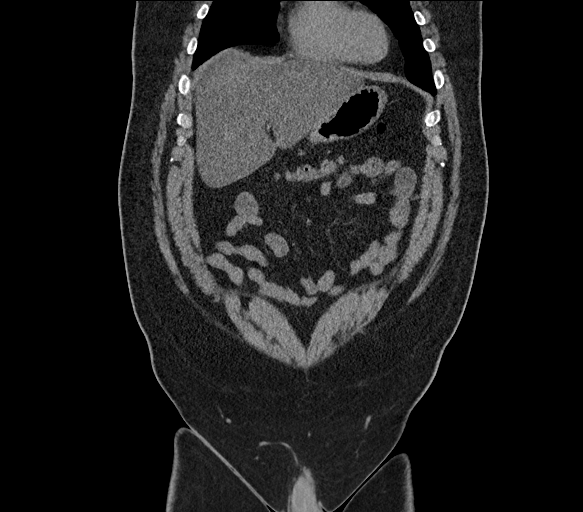
[im 95/213  soft-tissue]
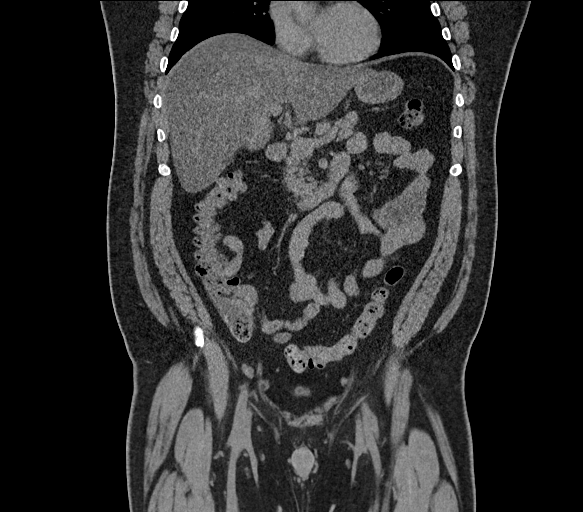
[im 118/213  soft-tissue]
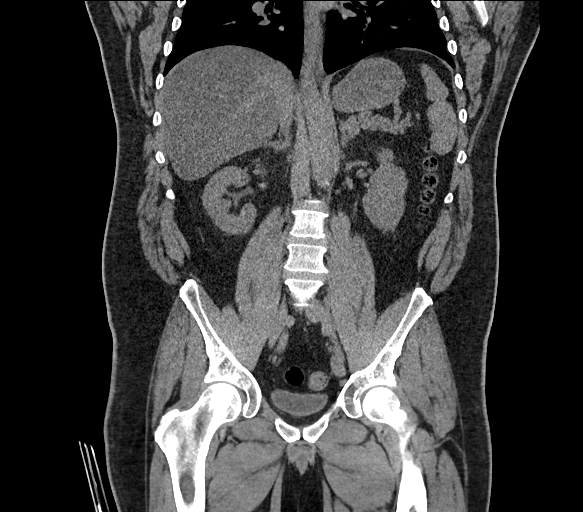

[16 of 46 positions shown; findings below may reference images not displayed]

FINDINGS: Lower chest: Unremarkable.

Hepatobiliary: The liver shows diffusely decreased attenuation
suggesting fat deposition. No suspicious focal abnormality in the
liver on this study without intravenous contrast. Gallbladder is
surgically absent. No intrahepatic or extrahepatic biliary dilation.

Pancreas: No focal mass lesion. No dilatation of the main duct. No
intraparenchymal cyst. No peripancreatic edema.

Spleen: No splenomegaly. No focal mass lesion.

Adrenals/Urinary Tract: No adrenal nodule or mass. No evidence for
renal or ureteral stones. No secondary changes in either kidney or
ureter. No bladder stones.

Stomach/Bowel: Stomach is unremarkable. No gastric wall thickening.
No evidence of outlet obstruction. Duodenum is normally positioned
as is the ligament of Treitz. No small bowel wall thickening. No
small bowel dilatation. The terminal ileum is normal. No gross
colonic mass. No colonic wall thickening.

Vascular/Lymphatic: There is mild atherosclerotic calcification of
the abdominal aorta without aneurysm. There is no gastrohepatic or
hepatoduodenal ligament lymphadenopathy. No retroperitoneal or
mesenteric lymphadenopathy. No pelvic sidewall lymphadenopathy.

Reproductive: The prostate gland and seminal vesicles are
unremarkable.

Other: No intraperitoneal free fluid.

Musculoskeletal: No worrisome lytic or sclerotic osseous
abnormality.
IMPRESSION: 1. No acute findings in the abdomen or pelvis. Specifically, no
findings to explain the patient's history of right flank pain.
2. Hepatic steatosis.
3. Aortic Atherosclerosis ([id number]).
